# Patient Record
Sex: Female | Born: 1955
Health system: Southern US, Community
[De-identification: ages and names within clinical notes are randomized; demographics above are authoritative.]

## PROBLEM LIST (undated history)

## (undated) DIAGNOSIS — M1811 Unilateral primary osteoarthritis of first carpometacarpal joint, right hand: Secondary | ICD-10-CM

## (undated) DIAGNOSIS — J189 Pneumonia, unspecified organism: Secondary | ICD-10-CM

## (undated) DIAGNOSIS — I10 Essential (primary) hypertension: Secondary | ICD-10-CM

## (undated) DIAGNOSIS — T8859XA Other complications of anesthesia, initial encounter: Secondary | ICD-10-CM

## (undated) DIAGNOSIS — I7300 Raynaud's syndrome without gangrene: Secondary | ICD-10-CM

## (undated) DIAGNOSIS — E559 Vitamin D deficiency, unspecified: Secondary | ICD-10-CM

## (undated) DIAGNOSIS — Z780 Asymptomatic menopausal state: Secondary | ICD-10-CM

## (undated) DIAGNOSIS — E785 Hyperlipidemia, unspecified: Secondary | ICD-10-CM

## (undated) DIAGNOSIS — N200 Calculus of kidney: Secondary | ICD-10-CM

## (undated) DIAGNOSIS — T4145XA Adverse effect of unspecified anesthetic, initial encounter: Secondary | ICD-10-CM

## (undated) DIAGNOSIS — F419 Anxiety disorder, unspecified: Secondary | ICD-10-CM

## (undated) DIAGNOSIS — K219 Gastro-esophageal reflux disease without esophagitis: Secondary | ICD-10-CM

## (undated) DIAGNOSIS — I4719 Other supraventricular tachycardia: Secondary | ICD-10-CM

## (undated) HISTORY — DX: Essential (primary) hypertension: I10

## (undated) HISTORY — PX: ABDOMINAL HYSTERECTOMY: SHX81

## (undated) HISTORY — DX: Other supraventricular tachycardia: I47.19

## (undated) HISTORY — PX: LAPAROSCOPIC APPENDECTOMY: SUR753

## (undated) HISTORY — DX: Vitamin D deficiency, unspecified: E55.9

## (undated) HISTORY — DX: Asymptomatic menopausal state: Z78.0

## (undated) HISTORY — DX: Pneumonia, unspecified organism: J18.9

## (undated) HISTORY — PX: ABLATION ON ENDOMETRIOSIS: SHX5787

## (undated) HISTORY — DX: Anxiety disorder, unspecified: F41.9

## (undated) HISTORY — DX: Calculus of kidney: N20.0

## (undated) HISTORY — DX: Raynaud's syndrome without gangrene: I73.00

## (undated) HISTORY — DX: Hyperlipidemia, unspecified: E78.5

---

## 1985-10-27 HISTORY — PX: FOOT SURGERY: SHX648

## 2000-08-03 ENCOUNTER — Encounter: Payer: Self-pay | Admitting: Internal Medicine

## 2000-08-03 ENCOUNTER — Encounter: Admission: RE | Admit: 2000-08-03 | Discharge: 2000-08-03 | Payer: Self-pay | Admitting: Internal Medicine

## 2001-08-05 ENCOUNTER — Encounter: Payer: Self-pay | Admitting: Internal Medicine

## 2001-08-05 ENCOUNTER — Encounter: Admission: RE | Admit: 2001-08-05 | Discharge: 2001-08-05 | Payer: Self-pay | Admitting: Internal Medicine

## 2002-01-24 ENCOUNTER — Encounter: Admission: RE | Admit: 2002-01-24 | Discharge: 2002-01-24 | Payer: Self-pay | Admitting: Internal Medicine

## 2002-01-24 ENCOUNTER — Encounter: Payer: Self-pay | Admitting: Internal Medicine

## 2006-12-05 ENCOUNTER — Encounter: Admission: RE | Admit: 2006-12-05 | Discharge: 2006-12-05 | Payer: Self-pay | Admitting: Otolaryngology

## 2008-08-30 ENCOUNTER — Encounter: Admission: RE | Admit: 2008-08-30 | Discharge: 2008-08-30 | Payer: Self-pay | Admitting: Internal Medicine

## 2009-12-06 ENCOUNTER — Encounter: Admission: RE | Admit: 2009-12-06 | Discharge: 2009-12-06 | Payer: Self-pay | Admitting: Internal Medicine

## 2010-12-26 ENCOUNTER — Other Ambulatory Visit: Payer: Self-pay | Admitting: Internal Medicine

## 2010-12-26 ENCOUNTER — Ambulatory Visit
Admission: RE | Admit: 2010-12-26 | Discharge: 2010-12-26 | Disposition: A | Discharge status: Home / Self Care | Payer: PRIVATE HEALTH INSURANCE | Source: Ambulatory Visit | Attending: Internal Medicine | Admitting: Internal Medicine

## 2010-12-26 DIAGNOSIS — Z0000 Encounter for general adult medical examination without abnormal findings: Secondary | ICD-10-CM

## 2012-01-06 ENCOUNTER — Other Ambulatory Visit: Payer: Self-pay | Admitting: Internal Medicine

## 2012-01-06 DIAGNOSIS — Z1231 Encounter for screening mammogram for malignant neoplasm of breast: Secondary | ICD-10-CM

## 2012-01-19 ENCOUNTER — Ambulatory Visit
Admission: RE | Admit: 2012-01-19 | Discharge: 2012-01-19 | Disposition: A | Discharge status: Home / Self Care | Payer: PRIVATE HEALTH INSURANCE | Source: Ambulatory Visit | Attending: Internal Medicine | Admitting: Internal Medicine

## 2012-01-19 DIAGNOSIS — Z1231 Encounter for screening mammogram for malignant neoplasm of breast: Secondary | ICD-10-CM

## 2013-07-12 ENCOUNTER — Other Ambulatory Visit: Payer: Self-pay

## 2013-07-12 DIAGNOSIS — Z1231 Encounter for screening mammogram for malignant neoplasm of breast: Secondary | ICD-10-CM

## 2013-07-28 ENCOUNTER — Ambulatory Visit
Admission: RE | Admit: 2013-07-28 | Discharge: 2013-07-28 | Disposition: A | Discharge status: Home / Self Care | Payer: BC Managed Care – PPO | Source: Ambulatory Visit

## 2013-07-28 DIAGNOSIS — Z1231 Encounter for screening mammogram for malignant neoplasm of breast: Secondary | ICD-10-CM

## 2015-02-12 ENCOUNTER — Ambulatory Visit
Admission: RE | Admit: 2015-02-12 | Discharge: 2015-02-12 | Disposition: A | Discharge status: Home / Self Care | Payer: BLUE CROSS/BLUE SHIELD | Source: Ambulatory Visit | Attending: Geriatric Medicine | Admitting: Geriatric Medicine

## 2015-02-12 ENCOUNTER — Other Ambulatory Visit: Payer: Self-pay | Admitting: Geriatric Medicine

## 2015-02-12 DIAGNOSIS — M5489 Other dorsalgia: Secondary | ICD-10-CM

## 2016-04-24 LAB — HM COLONOSCOPY

## 2017-04-08 DIAGNOSIS — Z1231 Encounter for screening mammogram for malignant neoplasm of breast: Secondary | ICD-10-CM | POA: Diagnosis not present

## 2017-04-13 DIAGNOSIS — E559 Vitamin D deficiency, unspecified: Secondary | ICD-10-CM | POA: Diagnosis not present

## 2017-04-13 DIAGNOSIS — I10 Essential (primary) hypertension: Secondary | ICD-10-CM | POA: Diagnosis not present

## 2017-04-13 DIAGNOSIS — Z23 Encounter for immunization: Secondary | ICD-10-CM | POA: Diagnosis not present

## 2017-04-13 DIAGNOSIS — Z79899 Other long term (current) drug therapy: Secondary | ICD-10-CM | POA: Diagnosis not present

## 2017-04-13 DIAGNOSIS — Z0000 Encounter for general adult medical examination without abnormal findings: Secondary | ICD-10-CM | POA: Diagnosis not present

## 2017-04-14 ENCOUNTER — Encounter: Payer: Self-pay | Admitting: Neurology

## 2017-04-16 NOTE — Progress Notes (Signed)
Elaine Harper was seen today in the movement disorders clinic for neurologic consultation at the request of Lajean Manes, MD.  The consultation is for the evaluation of L hand tremor.  She is L hand dominant   Specific Symptoms:   Tremor: Yes.     How long has it been going on? 6 months  At rest or with activation?  activation  When is it noted the most?  Putting on makeup  Fam hx of tremor?  Father did when he was over 63 y/o  Located where?  L hand  Affected by caffeine:  No. (2 cups coffee per day)  Affected by alcohol:  No. (3 glasses wine per day)  Affected by stress:  No.  Affected by fatigue:  No.  Spills soup if on spoon:  Yes.    Spills glass of liquid if full:  No.  Affects ADL's (tying shoes, brushing teeth, etc):  Yes.     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   (seemed to change when she changed to a hormone patch)  Acting out dreams:  No. Wet Pillows: No. Postural symptoms:  No.  Falls?  No. Bradykinesia symptoms: no bradykinesia noted Loss of smell:  No. Loss of taste:  No. Urinary Incontinence:  No. Difficulty Swallowing:  No. Handwriting, micrographia: No. (but a little tremulous) Depression:  No. Memory changes:  No. N/V:  No. Lightheaded:  No.  Syncope: No. Diplopia:  No. Dyskinesia:  No.  Neuroimaging has not previously been performed in the recent years but had one many years ago.  ALLERGIES:  No Known Allergies  CURRENT MEDICATIONS:  Outpatient Encounter Prescriptions as of 04/21/2017  Medication Sig  . ACZONE 5 % topical gel as needed.  Marland Kitchen aspirin EC 81 MG tablet Take 81 mg by mouth daily.  . Calcium 600-400 MG-UNIT CHEW Chew by mouth.  . cholecalciferol (VITAMIN D) 1000 units tablet Take 1,000 Units by mouth daily.  Marland Kitchen doxycycline (VIBRAMYCIN) 100 MG capsule Take 100 mg by mouth daily.  . fluorouracil (EFUDEX) 5  % cream as needed.  . hydrochlorothiazide (HYDRODIURIL) 25 MG tablet Take 12.5 mg by mouth daily.  Marland Kitchen lisinopril (PRINIVIL,ZESTRIL) 10 MG tablet Take 10 mg by mouth daily.  Marland Kitchen LORazepam (ATIVAN) 1 MG tablet daily as needed.  Marland Kitchen MINIVELLE 0.025 MG/24HR daily.  . naproxen sodium (ANAPROX) 220 MG tablet Take 220 mg by mouth 2 (two) times daily with a meal.  . tretinoin (RETIN-A) 0.025 % cream as needed.  . Turmeric 450 MG CAPS Take by mouth daily.   No facility-administered encounter medications on file as of 04/21/2017.     PAST MEDICAL HISTORY:   Past Medical History:  Diagnosis Date  . Anxiety   . Hypertension   . Menopause     PAST SURGICAL HISTORY:   Past Surgical History:  Procedure Laterality Date  . ABDOMINAL HYSTERECTOMY    . ABLATION ON ENDOMETRIOSIS    . LAPAROSCOPIC APPENDECTOMY      SOCIAL HISTORY:   Social History   Social History  . Marital status: Married    Spouse name: N/A  . Number of children: N/A  . Years of education: N/A   Occupational History  . practice admin     [employer]   Social History Main Topics  . Smoking status: Former Smoker    Quit date: 04/22/1987  .  Smokeless tobacco: Never Used  . Alcohol use Yes     Comment: 3 glasses wine per night  . Drug use: No  . Sexual activity: Not on file   Other Topics Concern  . Not on file   Social History Narrative  . No narrative on file    FAMILY HISTORY:   Family Status  Relation Status  . Mother Deceased  . Father Deceased  . Sister Alive  . Brother Alive  . Brother Deceased  . Brother Alive    ROS:  A complete 10 system review of systems was obtained and was unremarkable apart from what is mentioned above.  PHYSICAL EXAMINATION:    VITALS:   Vitals:   04/21/17 0931  BP: 132/70  Pulse: 60  SpO2: 97%  Weight: 148 lb (67.1 kg)  Height: 5\' 7"  (1.702 m)    GEN:  The patient appears stated age and is in NAD. HEENT:  Normocephalic, atraumatic.  The mucous membranes are  moist. The superficial temporal arteries are without ropiness or tenderness. CV:  RRR Lungs:  CTAB Neck/HEME:  There are no carotid bruits bilaterally.  Neurological examination:  Orientation: The patient is alert and oriented x3. Fund of knowledge is appropriate.  Recent and remote memory are intact.  Attention and concentration are normal.    Able to name objects and repeat phrases. Cranial nerves: There is good facial symmetry. Pupils are equal round and reactive to light bilaterally. Fundoscopic exam reveals clear margins bilaterally. Extraocular muscles are intact. The visual fields are full to confrontational testing. The speech is fluent and clear. Soft palate rises symmetrically and there is no tongue deviation. Hearing is intact to conversational tone. Sensation: Sensation is intact to light and pinprick throughout (facial, trunk, extremities). Vibration is intact at the bilateral big toe. There is no extinction with double simultaneous stimulation. There is no sensory dermatomal level identified. Motor: Strength is 5/5 in the bilateral upper and lower extremities.   Shoulder shrug is equal and symmetric.  There is no pronator drift. Deep tendon reflexes: Deep tendon reflexes are 3/4 at the bilateral biceps, triceps, brachioradialis, patella and achilles. Plantar responses are downgoing bilaterally.  Movement examination: Tone: There is normal tone in the bilateral upper extremities.  The tone in the lower extremities is normal.  Abnormal movements: There is no rest tremor.  There is minimal tremor of the outstretched hand on the L only.  It is somewhat worse when she holds a weight.  It is worse with intention.  She has some trouble with archimedes spirals on the L but when drawing with the L hand (her dominant hand) I was able to note a little tremor on the right.  She does have tremor when pouring water from one glass to another and while the left is worse than the right, tremor is evident  when the full glass in the right hand.   Coordination:  There is no decremation with RAM's, with any form of RAMS, including alternating supination and pronation of the forearm, hand opening and closing, finger taps, heel taps and toe taps. Gait and Station: The patient has no difficulty arising out of a deep-seated chair without the use of the hands. The patient's stride length is normal with good arm swing.    Labs: Patient pulled up her patient portal.  Last TSH was on 02/08/2014 and was normal at 4.30.  She did have other lab work on 04/13/2017.  White blood cells were 3.9, hemoglobin 14.2, hematocrit  41.0 and platelets 261.  Sodium was 137, potassium 5.2, chloride 101, CO2 28, BUN 14 and creatinine 0.74 and glucose 94.  ASSESSMENT/PLAN:  1.  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  While she does have minor tremor on the right, it is much more significant on the left.  This can happen in about one third of cases.  Given the asymmetric nature and her hyperreflexia, we decided to go ahead and do an MRI of the brain.  Decided to hold off on an MRI of the cervical spine given no significant neck pain.  No other features of myelopathy either.  Reassured her that I did not see any evidence of a neurodegenerative process such as Parkinson's disease.  -talked with her about complex effect that alcohol can have on tremor.  Chronic alcohol use can produce a tremor but discontinuation of the alcohol can also cause a tremulous state for quite some time.  Talked with her about affects of stress on tremor but she doesn't note that.  -I am going to go ahead and do a TSH.  It has been a few years since this was done.  -We discussed that this can continue to gradually get worse with time.  We discussed that some medications can worsen this, as can caffeine use.  We discussed medication therapy as well as surgical therapy.  Ultimately, the patient decided to think about it and hold off on medicine.  She  is fairly certain she does not want a beta blocker as she runs and does not want to decrease the ability to get her heart rate up.  We did talk about primidone in detail and she is going to think about that.  I will see her back pending her decision on medicine and pending the above results.  2.  Much greater than 50% of this visit was spent in counseling and coordinating care.  Total face to face time:  45 min       Cc:  Lajean Manes, MD

## 2017-04-21 ENCOUNTER — Other Ambulatory Visit: Payer: 59

## 2017-04-21 ENCOUNTER — Ambulatory Visit (INDEPENDENT_AMBULATORY_CARE_PROVIDER_SITE_OTHER): Payer: 59 | Admitting: Neurology

## 2017-04-21 ENCOUNTER — Encounter: Payer: Self-pay | Admitting: Neurology

## 2017-04-21 VITALS — BP 132/70 | HR 60 | Ht 67.0 in | Wt 148.0 lb

## 2017-04-21 DIAGNOSIS — R292 Abnormal reflex: Secondary | ICD-10-CM

## 2017-04-21 DIAGNOSIS — R251 Tremor, unspecified: Secondary | ICD-10-CM

## 2017-04-21 LAB — TSH: TSH: 2.84 m[IU]/L

## 2017-04-21 NOTE — Patient Instructions (Signed)
1. Your provider has requested that you have labwork completed today. Please go to [facility] ([id number]) on the second floor of this building before leaving the office today. You do not need to check in. If you are not called within 15 minutes please check with the front desk.   2. We have sent a referral to  Imaging for your MRI and they will call you directly to schedule your appt. They are located at [address] If you need to contact them directly please call [phone].    

## 2017-04-22 ENCOUNTER — Telehealth: Payer: Self-pay | Admitting: Neurology

## 2017-04-22 NOTE — Telephone Encounter (Signed)
[facility] making patient aware (Per DPR) that labs okay. She is to call back with any questions.

## 2017-04-22 NOTE — Telephone Encounter (Signed)
-----   Message from [place], DO sent at 04/22/2017  8:06 AM EDT ----- Let pt know that her TSH is normal

## 2017-05-01 ENCOUNTER — Telehealth: Payer: Self-pay | Admitting: Neurology

## 2017-05-01 NOTE — Telephone Encounter (Signed)
Spoke with patient and made her aware it looks like records have been sent for medical review.  She will call cancel scan for now and r/s once we receive authorization.   Lesly Rubenstein- can you keep an eye on referral and let me know when authorized?

## 2017-05-01 NOTE — Telephone Encounter (Signed)
PT called and said she is having an MRI on Monday and the imaging told her they have not received prior auth from [employer]

## 2017-05-04 ENCOUNTER — Other Ambulatory Visit: Payer: 59

## 2017-05-11 DIAGNOSIS — D485 Neoplasm of uncertain behavior of skin: Secondary | ICD-10-CM | POA: Diagnosis not present

## 2017-05-12 ENCOUNTER — Telehealth: Payer: Self-pay | Admitting: Neurology

## 2017-05-12 NOTE — Telephone Encounter (Signed)
Have you heard this needs peer to peer?

## 2017-05-12 NOTE — Telephone Encounter (Signed)
Caller: Andris Flurry  Urgent? No  Reason for the call: [facility] is needing a peer to peer regarding her prior Authorization for her MRI. She said her status is pending for her MRI. Her case # is [id number]. Please call [phone]. Thanks

## 2017-05-13 NOTE — Telephone Encounter (Signed)
Thanks so much!  Dr. Carles Collet Juluis Rainier.

## 2017-05-13 NOTE — Telephone Encounter (Signed)
FYI - Dr. Carles Collet

## 2017-05-13 NOTE — Telephone Encounter (Signed)
Per Clinical Coord. Domenica Reamer @ [employer] this case was not approved because of the reason of exam is being ordered (any movement disorder, tremors) it requires a peer to peer. They will call tomorrow 07/19 @ 11:00am for a peer to peer with Dr Tat.

## 2017-05-13 NOTE — Telephone Encounter (Signed)
I spoke to [facility] and Dr. Mickie Hillier will be calling Dr Tat at 12:00pm until 12:15pm for cpt [phone]

## 2017-05-13 NOTE — Telephone Encounter (Signed)
I will be in with patients at that time.  Can we schedule that at noon when I am not in with other patients

## 2017-05-13 NOTE — Telephone Encounter (Signed)
Is this possible?

## 2017-05-15 NOTE — Telephone Encounter (Signed)
She never received a call.   Is there a number we can contact for peer-to-peer?

## 2017-05-15 NOTE — Telephone Encounter (Signed)
FYI DR. Tat

## 2017-05-15 NOTE — Telephone Encounter (Signed)
I called.  Would not do peer to peer.  Scheduled for 12:15pm on monday

## 2017-05-15 NOTE — Telephone Encounter (Signed)
[phone] option 3 Dr. Mickie Hillier was the Dr that was supposed to call

## 2017-05-18 NOTE — Telephone Encounter (Signed)
Approval: [phone] good through 07/02/17

## 2017-05-18 NOTE — Telephone Encounter (Signed)
Information put in referral. Patient made aware she can r/s her scan.

## 2017-05-20 ENCOUNTER — Ambulatory Visit
Admission: RE | Admit: 2017-05-20 | Discharge: 2017-05-20 | Disposition: A | Discharge status: Home / Self Care | Payer: 59 | Source: Ambulatory Visit | Attending: Neurology | Admitting: Neurology

## 2017-05-20 DIAGNOSIS — R292 Abnormal reflex: Secondary | ICD-10-CM

## 2017-05-20 DIAGNOSIS — R251 Tremor, unspecified: Secondary | ICD-10-CM | POA: Diagnosis not present

## 2017-05-21 ENCOUNTER — Telehealth: Payer: Self-pay | Admitting: Neurology

## 2017-05-21 NOTE — Telephone Encounter (Signed)
-----   Message from Cameron Sprang, MD sent at 05/21/2017  1:28 PM EDT ----- Pls let patient know MRI brain did not show any evidence of tumor, stroke, or bleed, it showed age-related changes.

## 2017-05-21 NOTE — Telephone Encounter (Signed)
Left message on machine for patient to call back.

## 2017-05-22 NOTE — Telephone Encounter (Signed)
Left message on machine for patient to call back.

## 2017-05-22 NOTE — Telephone Encounter (Signed)
Patient made aware of results.  

## 2017-05-22 NOTE — Telephone Encounter (Signed)
PT returned your call..  °

## 2017-05-25 DIAGNOSIS — D485 Neoplasm of uncertain behavior of skin: Secondary | ICD-10-CM | POA: Diagnosis not present

## 2017-05-25 DIAGNOSIS — C44112 Basal cell carcinoma of skin of right eyelid, including canthus: Secondary | ICD-10-CM | POA: Diagnosis not present

## 2017-06-11 DIAGNOSIS — C44112 Basal cell carcinoma of skin of right eyelid, including canthus: Secondary | ICD-10-CM | POA: Diagnosis not present

## 2017-06-25 DIAGNOSIS — S0181XA Laceration without foreign body of other part of head, initial encounter: Secondary | ICD-10-CM | POA: Diagnosis not present

## 2017-06-25 DIAGNOSIS — S01401A Unspecified open wound of right cheek and temporomandibular area, initial encounter: Secondary | ICD-10-CM | POA: Diagnosis not present

## 2017-06-25 DIAGNOSIS — C44119 Basal cell carcinoma of skin of left eyelid, including canthus: Secondary | ICD-10-CM | POA: Diagnosis not present

## 2017-07-16 DIAGNOSIS — L570 Actinic keratosis: Secondary | ICD-10-CM | POA: Diagnosis not present

## 2017-07-16 DIAGNOSIS — C44219 Basal cell carcinoma of skin of left ear and external auricular canal: Secondary | ICD-10-CM | POA: Diagnosis not present

## 2017-07-16 DIAGNOSIS — L814 Other melanin hyperpigmentation: Secondary | ICD-10-CM | POA: Diagnosis not present

## 2017-07-16 DIAGNOSIS — Z85828 Personal history of other malignant neoplasm of skin: Secondary | ICD-10-CM | POA: Diagnosis not present

## 2017-08-03 DIAGNOSIS — Z483 Aftercare following surgery for neoplasm: Secondary | ICD-10-CM | POA: Diagnosis not present

## 2017-08-03 DIAGNOSIS — C441121 Basal cell carcinoma of skin of right upper eyelid, including canthus: Secondary | ICD-10-CM | POA: Diagnosis not present

## 2017-08-03 DIAGNOSIS — Z85828 Personal history of other malignant neoplasm of skin: Secondary | ICD-10-CM | POA: Diagnosis not present

## 2017-08-03 DIAGNOSIS — Z481 Encounter for planned postprocedural wound closure: Secondary | ICD-10-CM | POA: Diagnosis not present

## 2017-08-03 DIAGNOSIS — S01401A Unspecified open wound of right cheek and temporomandibular area, initial encounter: Secondary | ICD-10-CM | POA: Diagnosis not present

## 2017-08-03 DIAGNOSIS — S01101A Unspecified open wound of right eyelid and periocular area, initial encounter: Secondary | ICD-10-CM | POA: Diagnosis not present

## 2017-08-03 DIAGNOSIS — H02001 Unspecified entropion of right upper eyelid: Secondary | ICD-10-CM | POA: Diagnosis not present

## 2017-08-03 DIAGNOSIS — S0181XA Laceration without foreign body of other part of head, initial encounter: Secondary | ICD-10-CM | POA: Diagnosis not present

## 2017-08-18 DIAGNOSIS — Z85828 Personal history of other malignant neoplasm of skin: Secondary | ICD-10-CM | POA: Diagnosis not present

## 2017-08-18 DIAGNOSIS — C44219 Basal cell carcinoma of skin of left ear and external auricular canal: Secondary | ICD-10-CM | POA: Diagnosis not present

## 2017-08-21 DIAGNOSIS — H43811 Vitreous degeneration, right eye: Secondary | ICD-10-CM | POA: Diagnosis not present

## 2017-11-12 DIAGNOSIS — Z79899 Other long term (current) drug therapy: Secondary | ICD-10-CM | POA: Diagnosis not present

## 2017-11-12 DIAGNOSIS — I10 Essential (primary) hypertension: Secondary | ICD-10-CM | POA: Diagnosis not present

## 2017-11-23 DIAGNOSIS — H43811 Vitreous degeneration, right eye: Secondary | ICD-10-CM | POA: Diagnosis not present

## 2018-04-30 DIAGNOSIS — Z1231 Encounter for screening mammogram for malignant neoplasm of breast: Secondary | ICD-10-CM | POA: Diagnosis not present

## 2018-05-03 DIAGNOSIS — I10 Essential (primary) hypertension: Secondary | ICD-10-CM | POA: Diagnosis not present

## 2018-05-03 DIAGNOSIS — Z0000 Encounter for general adult medical examination without abnormal findings: Secondary | ICD-10-CM | POA: Diagnosis not present

## 2018-05-03 DIAGNOSIS — Z79899 Other long term (current) drug therapy: Secondary | ICD-10-CM | POA: Diagnosis not present

## 2018-06-14 DIAGNOSIS — H04123 Dry eye syndrome of bilateral lacrimal glands: Secondary | ICD-10-CM | POA: Diagnosis not present

## 2018-07-06 DIAGNOSIS — H04123 Dry eye syndrome of bilateral lacrimal glands: Secondary | ICD-10-CM | POA: Diagnosis not present

## 2018-08-27 DIAGNOSIS — D225 Melanocytic nevi of trunk: Secondary | ICD-10-CM | POA: Diagnosis not present

## 2018-08-27 DIAGNOSIS — D1801 Hemangioma of skin and subcutaneous tissue: Secondary | ICD-10-CM | POA: Diagnosis not present

## 2018-08-27 DIAGNOSIS — Z85828 Personal history of other malignant neoplasm of skin: Secondary | ICD-10-CM | POA: Diagnosis not present

## 2018-08-27 DIAGNOSIS — L570 Actinic keratosis: Secondary | ICD-10-CM | POA: Diagnosis not present

## 2018-08-27 DIAGNOSIS — C4371 Malignant melanoma of right lower limb, including hip: Secondary | ICD-10-CM | POA: Diagnosis not present

## 2018-08-27 DIAGNOSIS — C44712 Basal cell carcinoma of skin of right lower limb, including hip: Secondary | ICD-10-CM | POA: Diagnosis not present

## 2018-09-13 ENCOUNTER — Other Ambulatory Visit: Payer: Self-pay | Admitting: General Surgery

## 2018-09-13 ENCOUNTER — Other Ambulatory Visit: Payer: Self-pay

## 2018-09-13 ENCOUNTER — Encounter (HOSPITAL_BASED_OUTPATIENT_CLINIC_OR_DEPARTMENT_OTHER): Payer: Self-pay | Admitting: *Deleted

## 2018-09-13 DIAGNOSIS — C4371 Malignant melanoma of right lower limb, including hip: Secondary | ICD-10-CM

## 2018-09-13 NOTE — H&P (Signed)
Elaine Harper Documented: 09/13/2018 11:51 AM Location: [facility] Patient #: [id number] DOB: Elaine 23, 1957 Married / Language: Elaine Harper / Race: White Female   History of Present Illness Stark Klein MD; 09/13/2018 1:50 PM) The patient is a 62 year old female who presents with malignant melanoma. Pt is a 62 yo F referred for consultation by Dr. Ubaldo Glassing for a diagnosis of melanoma 08/27/2018. She sees dermatology at least 3-4 times per year due to h/o basal cell carcinomas. This mole had not caught her attention. She had a biopsy of a lesion near the proximal lateral right calf that showed malignant melanoma, superficial spreading, clarks IV, breslow 1.1 mm, peripheral margins +, deep margin -, no ulceration or satellitosis. Mitoses were 2/mm2. no LVI, neurotropism, or regression was seen.; TIL were non brisk. She has no other history of melanoma or other cancer, but her brother had melanoma.    Past Surgical History (Elaine Harper, [place]; 09/13/2018 11:51 AM) Appendectomy  Foot Surgery  Left. Hysterectomy (not due to cancer) - Complete   Diagnostic Studies History (Elaine Harper, CMA; 09/13/2018 11:51 AM) Colonoscopy  1-5 years ago Mammogram  within last year Pap Smear  >5 years ago  Allergies (Elaine Harper, [place]; 09/13/2018 11:53 AM) No Known Drug Allergies [09/13/2018]:  Medication History (Elaine Harper, CMA; 09/13/2018 11:55 AM) LORazepam (1MG  Tablet, Oral) Active. Spironolactone (50MG  Tablet, Oral) Active. Estradiol (1MG  Tablet, Oral) Active. hydroCHLOROthiazide (25MG  Tablet, Oral) Active. Lisinopril (10MG  Tablet, Oral) Active. Vitamin D (1000UNIT Tablet, Oral) Active. Medications Reconciled  Social History (Elaine Harper, CMA; 09/13/2018 11:51 AM) Alcohol use  Moderate alcohol use. Caffeine use  Coffee. No drug use  Tobacco use  Former smoker.  Family History (Elaine Harper, [place]; 09/13/2018 11:51 AM) Alcohol Abuse  Mother. Arthritis   Father, Mother. Heart Disease  Father. Melanoma  Brother.  Pregnancy / Birth History (Elaine Harper, [place]; 09/13/2018 11:51 AM) Age at menarche  34 years. Age of menopause  <43 Gravida  0 Para  0  Other Problems (Elaine Harper, CMA; 09/13/2018 11:51 AM) Arthritis  High blood pressure  Melanoma  Oophorectomy     Review of Systems (Elaine Harper CMA; 09/13/2018 11:51 AM) General Not Present- Appetite Loss, Chills, Fatigue, Fever, Night Sweats, Weight Gain and Weight Loss. Skin Present- Change in Wart/Mole. Not Present- Dryness, Hives, Jaundice, New Lesions, Non-Healing Wounds, Rash and Ulcer. HEENT Present- Hearing Loss, Ringing in the Ears and Wears glasses/contact lenses. Not Present- Earache, Hoarseness, Nose Bleed, Oral Ulcers, Seasonal Allergies, Sinus Pain, Sore Throat, Visual Disturbances and Yellow Eyes. Respiratory Not Present- Bloody sputum, Chronic Cough, Difficulty Breathing, Snoring and Wheezing. Cardiovascular Not Present- Chest Pain, Difficulty Breathing Lying Down, Leg Cramps, Palpitations, Rapid Heart Rate, Shortness of Breath and Swelling of Extremities. Gastrointestinal Not Present- Abdominal Pain, Bloating, Bloody Stool, Change in Bowel Habits, Chronic diarrhea, Constipation, Difficulty Swallowing, Excessive gas, Gets full quickly at meals, Hemorrhoids, Indigestion, Nausea, Rectal Pain and Vomiting. Female Genitourinary Not Present- Frequency, Nocturia, Painful Urination, Pelvic Pain and Urgency. Musculoskeletal Present- Back Pain. Not Present- Joint Pain, Joint Stiffness, Muscle Pain, Muscle Weakness and Swelling of Extremities. Neurological Present- Tremor. Not Present- Decreased Memory, Fainting, Headaches, Numbness, Seizures, Tingling, Trouble walking and Weakness. Psychiatric Not Present- Anxiety, Bipolar, Change in Sleep Pattern, Depression, Fearful and Frequent crying. Endocrine Not Present- Cold Intolerance, Excessive Hunger, Hair Changes, Heat  Intolerance, Hot flashes and New Diabetes. Hematology Not Present- Blood Thinners, Easy Bruising, Excessive bleeding, Gland problems, HIV and Persistent Infections.  Vitals (Elaine Harper CMA; 09/13/2018 11:55 AM) 09/13/2018  11:55 AM Weight: 151.13 lb Height: 67in Body Surface Area: 1.8 m Body Mass Index: 23.67 kg/m  Temp.: 97.62F(Oral)  BP: 140/86 (Sitting, Left Arm, Standard)       Physical Exam Stark Klein MD; 09/13/2018 1:53 PM) General Mental Status-Alert. General Appearance-Consistent with stated age. Hydration-Well hydrated. Voice-Normal.  Integumentary Note: 8-10 mm scar around 6-7 cm from knee.   Head and Neck Head-normocephalic, atraumatic with no lesions or palpable masses. Trachea-midline. Thyroid Gland Characteristics - normal size and consistency.  Eye Eyeball - Bilateral-Extraocular movements intact. Sclera/Conjunctiva - Bilateral-No scleral icterus.  Chest and Lung Exam Chest and lung exam reveals -quiet, even and easy respiratory effort with no use of accessory muscles and on auscultation, normal breath sounds, no adventitious sounds and normal vocal resonance. Inspection Chest Wall - Normal. Back - normal.  Cardiovascular Cardiovascular examination reveals -normal heart sounds, regular rate and rhythm with no murmurs and normal pedal pulses bilaterally.  Abdomen Inspection Inspection of the abdomen reveals - No Hernias. Palpation/Percussion Palpation and Percussion of the abdomen reveal - Soft, Non Tender, No Rebound tenderness, No Rigidity (guarding) and No hepatosplenomegaly. Auscultation Auscultation of the abdomen reveals - Bowel sounds normal.  Neurologic Neurologic evaluation reveals -alert and oriented x 3 with no impairment of recent or remote memory. Mental Status-Normal.  Musculoskeletal Global Assessment -Note: no gross deformities.  Normal Exam - Left-Upper Extremity Strength Normal  and Lower Extremity Strength Normal. Normal Exam - Right-Upper Extremity Strength Normal and Lower Extremity Strength Normal.  Lymphatic Head & Neck  General Head & Neck Lymphatics: Bilateral - Description - Normal. Axillary  General Axillary Region: Bilateral - Description - Normal. Tenderness - Non Tender. Femoral & Inguinal  Generalized Femoral & Inguinal Lymphatics: Bilateral - Description - No Generalized lymphadenopathy.    Assessment & Plan Stark Klein MD; 09/13/2018 1:56 PM) MALIGNANT MELANOMA OF RIGHT LOWER LEG (C43.71) Impression: Will plan wide local excision wtih 1-2 cm margins of the melanoma. Will also plan sentinel lymph node mapping and biopsy.  Discussed surgery with the patient. Discussed rationale for wide excision and the node injection/biopsy. Reviewed risks of wound breakdown, numbness, infection, and seroma with the patient. Discussed post op restrictions.  Could do surgery this week or next week, but pt would like to wait until after thanksgiving to do surgery because she has a trip planned to [place] next week. Current Plans Schedule for Surgery Pt Education - Melanoma: skin cancer   Signed by Stark Klein, MD (09/13/2018 1:56 PM)

## 2018-09-14 ENCOUNTER — Encounter (HOSPITAL_BASED_OUTPATIENT_CLINIC_OR_DEPARTMENT_OTHER)
Admission: RE | Admit: 2018-09-14 | Discharge: 2018-09-14 | Disposition: A | Discharge status: Home / Self Care | Payer: 59 | Source: Ambulatory Visit | Attending: General Surgery | Admitting: General Surgery

## 2018-09-14 DIAGNOSIS — Z79899 Other long term (current) drug therapy: Secondary | ICD-10-CM | POA: Diagnosis not present

## 2018-09-14 DIAGNOSIS — Z8249 Family history of ischemic heart disease and other diseases of the circulatory system: Secondary | ICD-10-CM | POA: Diagnosis not present

## 2018-09-14 DIAGNOSIS — Z808 Family history of malignant neoplasm of other organs or systems: Secondary | ICD-10-CM | POA: Diagnosis not present

## 2018-09-14 DIAGNOSIS — Z90710 Acquired absence of both cervix and uterus: Secondary | ICD-10-CM | POA: Diagnosis not present

## 2018-09-14 DIAGNOSIS — F419 Anxiety disorder, unspecified: Secondary | ICD-10-CM | POA: Diagnosis not present

## 2018-09-14 DIAGNOSIS — C4371 Malignant melanoma of right lower limb, including hip: Secondary | ICD-10-CM | POA: Diagnosis not present

## 2018-09-14 DIAGNOSIS — Z01810 Encounter for preprocedural cardiovascular examination: Secondary | ICD-10-CM | POA: Insufficient documentation

## 2018-09-14 DIAGNOSIS — Z8261 Family history of arthritis: Secondary | ICD-10-CM | POA: Diagnosis not present

## 2018-09-14 DIAGNOSIS — Z811 Family history of alcohol abuse and dependence: Secondary | ICD-10-CM | POA: Diagnosis not present

## 2018-09-14 DIAGNOSIS — I10 Essential (primary) hypertension: Secondary | ICD-10-CM | POA: Diagnosis not present

## 2018-09-14 DIAGNOSIS — M1990 Unspecified osteoarthritis, unspecified site: Secondary | ICD-10-CM | POA: Diagnosis not present

## 2018-09-14 DIAGNOSIS — I491 Atrial premature depolarization: Secondary | ICD-10-CM | POA: Diagnosis not present

## 2018-09-14 DIAGNOSIS — Z87891 Personal history of nicotine dependence: Secondary | ICD-10-CM | POA: Diagnosis not present

## 2018-09-14 LAB — CBC WITH DIFFERENTIAL/PLATELET
ABS IMMATURE GRANULOCYTES: 0.01 10*3/uL (ref 0.00–0.07)
BASOS ABS: 0 10*3/uL (ref 0.0–0.1)
Basophils Relative: 1 %
Eosinophils Absolute: 0.1 10*3/uL (ref 0.0–0.5)
Eosinophils Relative: 2 %
HEMATOCRIT: 41.6 % (ref 36.0–46.0)
Hemoglobin: 13.8 g/dL (ref 12.0–15.0)
Immature Granulocytes: 0 %
LYMPHS ABS: 2.1 10*3/uL (ref 0.7–4.0)
LYMPHS PCT: 43 %
MCH: 30.1 pg (ref 26.0–34.0)
MCHC: 33.2 g/dL (ref 30.0–36.0)
MCV: 90.8 fL (ref 80.0–100.0)
MONOS PCT: 7 %
Monocytes Absolute: 0.3 10*3/uL (ref 0.1–1.0)
NEUTROS ABS: 2.3 10*3/uL (ref 1.7–7.7)
NRBC: 0 % (ref 0.0–0.2)
Neutrophils Relative %: 47 %
Platelets: 260 10*3/uL (ref 150–400)
RBC: 4.58 MIL/uL (ref 3.87–5.11)
RDW: 11.9 % (ref 11.5–15.5)
WBC: 4.9 10*3/uL (ref 4.0–10.5)

## 2018-09-14 LAB — COMPREHENSIVE METABOLIC PANEL
ALBUMIN: 4 g/dL (ref 3.5–5.0)
ALT: 19 U/L (ref 0–44)
AST: 22 U/L (ref 15–41)
Alkaline Phosphatase: 47 U/L (ref 38–126)
Anion gap: 9 (ref 5–15)
BILIRUBIN TOTAL: 0.6 mg/dL (ref 0.3–1.2)
BUN: 17 mg/dL (ref 8–23)
CO2: 24 mmol/L (ref 22–32)
CREATININE: 0.84 mg/dL (ref 0.44–1.00)
Calcium: 8.9 mg/dL (ref 8.9–10.3)
Chloride: 104 mmol/L (ref 98–111)
GFR calc Af Amer: >60 mL/min (ref >60)
GFR calc non Af Amer: >60 mL/min (ref >60)
Glucose, Bld: 70 mg/dL (ref 70–99)
POTASSIUM: 4.6 mmol/L (ref 3.5–5.1)
Sodium: 137 mmol/L (ref 135–145)
TOTAL PROTEIN: 6.5 g/dL (ref 6.5–8.1)

## 2018-09-14 NOTE — Progress Notes (Addendum)
Ensure pre surgery drink given with instructions to complete by [place], surgical soap with instructions, pt verbalized understanding.  EKG reviewed by Dr. Lissa Hoard, will proceed with surgery as scheduled.

## 2018-09-16 ENCOUNTER — Other Ambulatory Visit: Payer: Self-pay

## 2018-09-16 ENCOUNTER — Ambulatory Visit (HOSPITAL_COMMUNITY)
Admission: RE | Admit: 2018-09-16 | Discharge: 2018-09-16 | Disposition: A | Discharge status: Home / Self Care | Payer: 59 | Source: Ambulatory Visit | Attending: General Surgery | Admitting: General Surgery

## 2018-09-16 ENCOUNTER — Ambulatory Visit (HOSPITAL_BASED_OUTPATIENT_CLINIC_OR_DEPARTMENT_OTHER): Payer: 59 | Admitting: Anesthesiology

## 2018-09-16 ENCOUNTER — Encounter (HOSPITAL_BASED_OUTPATIENT_CLINIC_OR_DEPARTMENT_OTHER): Payer: Self-pay | Admitting: *Deleted

## 2018-09-16 ENCOUNTER — Encounter (HOSPITAL_BASED_OUTPATIENT_CLINIC_OR_DEPARTMENT_OTHER)
Admission: RE | Disposition: A | Discharge status: Home / Self Care | Payer: Self-pay | Source: Ambulatory Visit | Attending: General Surgery

## 2018-09-16 ENCOUNTER — Ambulatory Visit (HOSPITAL_BASED_OUTPATIENT_CLINIC_OR_DEPARTMENT_OTHER)
Admission: RE | Admit: 2018-09-16 | Discharge: 2018-09-16 | Disposition: A | Discharge status: Home / Self Care | Payer: 59 | Source: Ambulatory Visit | Attending: General Surgery | Admitting: General Surgery

## 2018-09-16 DIAGNOSIS — Z808 Family history of malignant neoplasm of other organs or systems: Secondary | ICD-10-CM | POA: Insufficient documentation

## 2018-09-16 DIAGNOSIS — Z8249 Family history of ischemic heart disease and other diseases of the circulatory system: Secondary | ICD-10-CM | POA: Insufficient documentation

## 2018-09-16 DIAGNOSIS — M1990 Unspecified osteoarthritis, unspecified site: Secondary | ICD-10-CM | POA: Insufficient documentation

## 2018-09-16 DIAGNOSIS — F419 Anxiety disorder, unspecified: Secondary | ICD-10-CM | POA: Insufficient documentation

## 2018-09-16 DIAGNOSIS — Z79899 Other long term (current) drug therapy: Secondary | ICD-10-CM | POA: Insufficient documentation

## 2018-09-16 DIAGNOSIS — Z811 Family history of alcohol abuse and dependence: Secondary | ICD-10-CM | POA: Insufficient documentation

## 2018-09-16 DIAGNOSIS — Z90710 Acquired absence of both cervix and uterus: Secondary | ICD-10-CM | POA: Diagnosis not present

## 2018-09-16 DIAGNOSIS — I491 Atrial premature depolarization: Secondary | ICD-10-CM | POA: Insufficient documentation

## 2018-09-16 DIAGNOSIS — C4371 Malignant melanoma of right lower limb, including hip: Secondary | ICD-10-CM | POA: Diagnosis not present

## 2018-09-16 DIAGNOSIS — I10 Essential (primary) hypertension: Secondary | ICD-10-CM | POA: Insufficient documentation

## 2018-09-16 DIAGNOSIS — Z8261 Family history of arthritis: Secondary | ICD-10-CM | POA: Insufficient documentation

## 2018-09-16 DIAGNOSIS — Z87891 Personal history of nicotine dependence: Secondary | ICD-10-CM | POA: Insufficient documentation

## 2018-09-16 DIAGNOSIS — D0371 Melanoma in situ of right lower limb, including hip: Secondary | ICD-10-CM | POA: Diagnosis not present

## 2018-09-16 HISTORY — PX: MELANOMA EXCISION: SHX5266

## 2018-09-16 HISTORY — DX: Other complications of anesthesia, initial encounter: T88.59XA

## 2018-09-16 HISTORY — PX: SENTINEL NODE BIOPSY: SHX6608

## 2018-09-16 HISTORY — DX: Adverse effect of unspecified anesthetic, initial encounter: T41.45XA

## 2018-09-16 SURGERY — EXCISION, MELANOMA
Anesthesia: General | Laterality: Right

## 2018-09-16 MED ORDER — MIDAZOLAM HCL 2 MG/2ML IJ SOLN
INTRAMUSCULAR | Status: AC
  Filled 2018-09-16: qty 2

## 2018-09-16 MED ORDER — ONDANSETRON HCL 4 MG/2ML IJ SOLN: 4.0000 mg | Freq: Once | INTRAMUSCULAR | Status: DC | PRN

## 2018-09-16 MED ORDER — MIDAZOLAM HCL 2 MG/2ML IJ SOLN
INTRAMUSCULAR | Status: DC | PRN
  Administered 2018-09-16: 2 mg via INTRAVENOUS

## 2018-09-16 MED ORDER — GABAPENTIN 300 MG PO CAPS
300.0000 mg | ORAL_CAPSULE | ORAL | Status: AC
  Administered 2018-09-16: 300 mg via ORAL

## 2018-09-16 MED ORDER — DEXAMETHASONE SODIUM PHOSPHATE 10 MG/ML IJ SOLN
INTRAMUSCULAR | Status: DC | PRN
  Administered 2018-09-16: 10 mg via INTRAVENOUS

## 2018-09-16 MED ORDER — FENTANYL CITRATE (PF) 100 MCG/2ML IJ SOLN
50.0000 ug | INTRAMUSCULAR | Status: DC | PRN
  Administered 2018-09-16: 50 ug via INTRAVENOUS

## 2018-09-16 MED ORDER — ONDANSETRON HCL 4 MG/2ML IJ SOLN
INTRAMUSCULAR | Status: DC | PRN
  Administered 2018-09-16: 4 mg via INTRAVENOUS

## 2018-09-16 MED ORDER — TECHNETIUM TC 99M SULFUR COLLOID FILTERED
0.5000 | Freq: Once | INTRAVENOUS | Status: AC | PRN
  Administered 2018-09-16: 0.5 via INTRADERMAL

## 2018-09-16 MED ORDER — SODIUM CHLORIDE 0.9 % IR SOLN
Status: DC | PRN
  Administered 2018-09-16: 1000 mL

## 2018-09-16 MED ORDER — METHYLENE BLUE 0.5 % INJ SOLN
INTRAVENOUS | Status: AC
  Filled 2018-09-16: qty 10

## 2018-09-16 MED ORDER — CEFAZOLIN SODIUM-DEXTROSE 2-4 GM/100ML-% IV SOLN
2.0000 g | INTRAVENOUS | Status: AC
  Administered 2018-09-16: 2 g via INTRAVENOUS

## 2018-09-16 MED ORDER — FENTANYL CITRATE (PF) 100 MCG/2ML IJ SOLN
INTRAMUSCULAR | Status: AC
  Filled 2018-09-16: qty 2

## 2018-09-16 MED ORDER — DEXAMETHASONE SODIUM PHOSPHATE 10 MG/ML IJ SOLN
INTRAMUSCULAR | Status: AC
  Filled 2018-09-16: qty 1

## 2018-09-16 MED ORDER — EPHEDRINE SULFATE 50 MG/ML IJ SOLN
INTRAMUSCULAR | Status: DC | PRN
  Administered 2018-09-16: 10 mg via INTRAVENOUS
  Administered 2018-09-16 (×2): 5 mg via INTRAVENOUS

## 2018-09-16 MED ORDER — ONDANSETRON HCL 4 MG/2ML IJ SOLN
INTRAMUSCULAR | Status: AC
  Filled 2018-09-16: qty 2

## 2018-09-16 MED ORDER — FENTANYL CITRATE (PF) 100 MCG/2ML IJ SOLN
INTRAMUSCULAR | Status: DC | PRN
  Administered 2018-09-16 (×2): 50 ug via INTRAVENOUS

## 2018-09-16 MED ORDER — CHLORHEXIDINE GLUCONATE CLOTH 2 % EX PADS: 6.0000 | MEDICATED_PAD | Freq: Once | CUTANEOUS | Status: DC

## 2018-09-16 MED ORDER — BUPIVACAINE-EPINEPHRINE (PF) 0.25% -1:200000 IJ SOLN
INTRAMUSCULAR | Status: DC | PRN
  Administered 2018-09-16: 25 mL

## 2018-09-16 MED ORDER — FENTANYL CITRATE (PF) 100 MCG/2ML IJ SOLN: 25.0000 ug | INTRAMUSCULAR | Status: DC | PRN

## 2018-09-16 MED ORDER — SCOPOLAMINE 1 MG/3DAYS TD PT72: 1.0000 | MEDICATED_PATCH | Freq: Once | TRANSDERMAL | Status: DC | PRN

## 2018-09-16 MED ORDER — PROPOFOL 10 MG/ML IV BOLUS
INTRAVENOUS | Status: DC | PRN
  Administered 2018-09-16: 100 mg via INTRAVENOUS

## 2018-09-16 MED ORDER — ACETAMINOPHEN 500 MG PO TABS
ORAL_TABLET | ORAL | Status: AC
  Filled 2018-09-16: qty 2

## 2018-09-16 MED ORDER — PROPOFOL 10 MG/ML IV BOLUS
INTRAVENOUS | Status: AC
  Filled 2018-09-16: qty 20

## 2018-09-16 MED ORDER — OXYCODONE HCL 5 MG PO TABS: 5.0000 mg | ORAL_TABLET | Freq: Four times a day (QID) | ORAL | 0 refills | Status: DC | PRN

## 2018-09-16 MED ORDER — MIDAZOLAM HCL 2 MG/2ML IJ SOLN
1.0000 mg | INTRAMUSCULAR | Status: DC | PRN
  Administered 2018-09-16: 1 mg via INTRAVENOUS

## 2018-09-16 MED ORDER — GABAPENTIN 300 MG PO CAPS
ORAL_CAPSULE | ORAL | Status: AC
  Filled 2018-09-16: qty 1

## 2018-09-16 MED ORDER — LACTATED RINGERS IV SOLN
INTRAVENOUS | Status: DC
  Administered 2018-09-16: 13:00:00 via INTRAVENOUS
  Administered 2018-09-16: 10 mL/h via INTRAVENOUS

## 2018-09-16 MED ORDER — CEFAZOLIN SODIUM-DEXTROSE 1-4 GM/50ML-% IV SOLN
INTRAVENOUS | Status: AC
  Filled 2018-09-16: qty 100

## 2018-09-16 MED ORDER — ACETAMINOPHEN 500 MG PO TABS
1000.0000 mg | ORAL_TABLET | ORAL | Status: AC
  Administered 2018-09-16: 1000 mg via ORAL

## 2018-09-16 MED ORDER — METHYLENE BLUE 0.5 % INJ SOLN
INTRAVENOUS | Status: DC | PRN
  Administered 2018-09-16: .5 mL via SUBMUCOSAL

## 2018-09-16 SURGICAL SUPPLY — 72 items
ADH SKN CLS APL DERMABOND .7 (GAUZE/BANDAGES/DRESSINGS) ×2
APL SKNCLS STERI-STRIP NONHPOA (GAUZE/BANDAGES/DRESSINGS) ×2
APPLIER CLIP 11 MED OPEN (CLIP)
APPLIER CLIP 9.375 MED OPEN (MISCELLANEOUS) ×4
APR CLP MED 11 20 MLT OPN (CLIP)
APR CLP MED 9.3 20 MLT OPN (MISCELLANEOUS) ×2
BENZOIN TINCTURE PRP APPL 2/3 (GAUZE/BANDAGES/DRESSINGS) ×2 IMPLANT
BLADE CLIPPER SURG (BLADE) IMPLANT
BLADE HEX COATED 2.75 (ELECTRODE) ×4 IMPLANT
BLADE SURG 10 STRL SS (BLADE) ×4 IMPLANT
BLADE SURG 15 STRL LF DISP TIS (BLADE) ×2 IMPLANT
BLADE SURG 15 STRL SS (BLADE) ×4
BNDG COHESIVE 4X5 TAN STRL (GAUZE/BANDAGES/DRESSINGS) IMPLANT
BNDG GAUZE ELAST 4 BULKY (GAUZE/BANDAGES/DRESSINGS) IMPLANT
CANISTER SUCT 1200ML W/VALVE (MISCELLANEOUS) ×4 IMPLANT
CHLORAPREP W/TINT 26ML (MISCELLANEOUS) ×4 IMPLANT
CLIP APPLIE 11 MED OPEN (CLIP) IMPLANT
CLIP APPLIE 9.375 MED OPEN (MISCELLANEOUS) IMPLANT
CLIP VESOCCLUDE LG 6/CT (CLIP) IMPLANT
CLIP VESOCCLUDE MED 6/CT (CLIP) ×4 IMPLANT
CLIP VESOCCLUDE SM WIDE 6/CT (CLIP) ×4 IMPLANT
CLOSURE WOUND 1/2 X4 (GAUZE/BANDAGES/DRESSINGS) ×1
COVER MAYO STAND STRL (DRAPES) ×2 IMPLANT
COVER PROBE W GEL 5X96 (DRAPES) ×4 IMPLANT
COVER WAND RF STERILE (DRAPES) IMPLANT
DECANTER SPIKE VIAL GLASS SM (MISCELLANEOUS) IMPLANT
DERMABOND ADVANCED (GAUZE/BANDAGES/DRESSINGS) ×2
DERMABOND ADVANCED .7 DNX12 (GAUZE/BANDAGES/DRESSINGS) IMPLANT
DRAPE IMP U-DRAPE 54X76 (DRAPES) IMPLANT
DRAPE UTILITY XL STRL (DRAPES) ×8 IMPLANT
DRSG PAD ABDOMINAL 8X10 ST (GAUZE/BANDAGES/DRESSINGS) IMPLANT
DRSG TEGADERM 4X10 (GAUZE/BANDAGES/DRESSINGS) IMPLANT
DRSG TEGADERM 4X4.75 (GAUZE/BANDAGES/DRESSINGS) ×2 IMPLANT
ELECT REM PT RETURN 9FT ADLT (ELECTROSURGICAL) ×4
ELECTRODE REM PT RTRN 9FT ADLT (ELECTROSURGICAL) ×2 IMPLANT
GAUZE SPONGE 4X4 12PLY STRL (GAUZE/BANDAGES/DRESSINGS) IMPLANT
GAUZE SPONGE 4X4 12PLY STRL LF (GAUZE/BANDAGES/DRESSINGS) IMPLANT
GLOVE BIO SURGEON STRL SZ 6 (GLOVE) ×4 IMPLANT
GLOVE BIOGEL PI IND STRL 6.5 (GLOVE) ×2 IMPLANT
GLOVE BIOGEL PI INDICATOR 6.5 (GLOVE) ×2
GOWN STRL REUS W/ TWL LRG LVL3 (GOWN DISPOSABLE) ×2 IMPLANT
GOWN STRL REUS W/TWL 2XL LVL3 (GOWN DISPOSABLE) ×4 IMPLANT
GOWN STRL REUS W/TWL LRG LVL3 (GOWN DISPOSABLE) ×4
NDL HYPO 25X1 1.5 SAFETY (NEEDLE) ×4 IMPLANT
NDL SAFETY ECLIPSE 18X1.5 (NEEDLE) ×2 IMPLANT
NEEDLE HYPO 18GX1.5 SHARP (NEEDLE) ×4
NEEDLE HYPO 25X1 1.5 SAFETY (NEEDLE) ×4 IMPLANT
NS IRRIG 1000ML POUR BTL (IV SOLUTION) ×2 IMPLANT
PACK BASIN DAY SURGERY FS (CUSTOM PROCEDURE TRAY) ×4 IMPLANT
PACK UNIVERSAL I (CUSTOM PROCEDURE TRAY) ×4 IMPLANT
PENCIL BUTTON HOLSTER BLD 10FT (ELECTRODE) ×4 IMPLANT
SLEEVE SCD COMPRESS KNEE MED (MISCELLANEOUS) ×4 IMPLANT
SLING ARM FOAM STRAP LRG (SOFTGOODS) IMPLANT
SPONGE LAP 18X18 RF (DISPOSABLE) IMPLANT
STOCKINETTE IMPERVIOUS LG (DRAPES) ×2 IMPLANT
STRIP CLOSURE SKIN 1/2X4 (GAUZE/BANDAGES/DRESSINGS) ×1 IMPLANT
SUT ETHILON 2 0 FS 18 (SUTURE) IMPLANT
SUT MON AB 4-0 PC3 18 (SUTURE) ×4 IMPLANT
SUT SILK 2 0 SH (SUTURE) IMPLANT
SUT VIC AB 2-0 SH 27 (SUTURE) ×8
SUT VIC AB 2-0 SH 27XBRD (SUTURE) IMPLANT
SUT VIC AB 3-0 SH 27 (SUTURE) ×8
SUT VIC AB 3-0 SH 27X BRD (SUTURE) ×2 IMPLANT
SYR BULB 3OZ (MISCELLANEOUS) ×4 IMPLANT
SYR CONTROL 10ML LL (SYRINGE) ×4 IMPLANT
SYR TB 1ML LL NO SAFETY (SYRINGE) ×4 IMPLANT
TOWEL GREEN STERILE FF (TOWEL DISPOSABLE) ×4 IMPLANT
TOWEL OR NON WOVEN STRL DISP B (DISPOSABLE) ×4 IMPLANT
TUBE CONNECTING 20'X1/4 (TUBING) ×1
TUBE CONNECTING 20X1/4 (TUBING) ×3 IMPLANT
UNDERPAD 30X30 (UNDERPADS AND DIAPERS) ×4 IMPLANT
YANKAUER SUCT BULB TIP NO VENT (SUCTIONS) ×4 IMPLANT

## 2018-09-16 NOTE — Anesthesia Postprocedure Evaluation (Signed)
Anesthesia Post Note  Patient: Elaine Harper  Procedure(s) Performed: WIDE LOCAL EXCISION WITH ADVANCEMENT FLAP CLOSURE (N/A ) SENTINEL NODE BIOPSY OF RIGHT LOWER LEG MELANOMA (Right )     Patient location during evaluation: PACU Anesthesia Type: General Level of consciousness: awake and alert, oriented and awake Pain management: pain level controlled Vital Signs Assessment: post-procedure vital signs reviewed and stable Respiratory status: spontaneous breathing, nonlabored ventilation and respiratory function stable Cardiovascular status: blood pressure returned to baseline and stable Postop Assessment: no apparent nausea or vomiting Anesthetic complications: no    Last Vitals:  Vitals:   09/16/18 1409 09/16/18 1439  BP:  (!) 146/78  Pulse: 72 75  Resp: 14   Temp:  36.5 C  SpO2: 100% 99%    Last Pain:  Vitals:   09/16/18 1439  PainSc: 5                  Catalina Gravel

## 2018-09-16 NOTE — Op Note (Signed)
PRE-OPERATIVE DIAGNOSIS: cT2N0 right lower leg melanoma  POST-OPERATIVE DIAGNOSIS:  Same  PROCEDURE:  Procedure(s): Wide local excision 1-2 cm margins, advancement flap closure for defect 3.1 cm x 6.4 cm, right inguinal sentinel lymph node mapping and biopsy  SURGEON:  Surgeon(s): Stark Klein, MD  ANESTHESIA:   local and general  DRAINS: none   LOCAL MEDICATIONS USED:  MARCAINE    and XYLOCAINE   SPECIMEN:  Source of Specimen:  1 right inguinal sentinel lymph node, wide local excision right leg melanoma  FINDINGS:  No gross residual disease.  1 LN hot and blue  DISPOSITION OF SPECIMEN:  PATHOLOGY  COUNTS:  YES  PLAN OF CARE: Discharge to home after PACU  PATIENT DISPOSITION:  PACU - hemodynamically stable.   PROCEDURE:   Pt was identified in the holding area, taken to the OR, and placed supine on the OR table.  General anesthesia was induced.  Time out was performed according to the surgical safety checklist.  When all was correct, we continued.  One mL methylene blue was injected intradermally around the melanoma biopsy site.    The patient's right leg and groin were prepped and draped in sterile fashion.  The point of maximum signal intensity was identified with the neoprobe.  A 4 cm incision was made with a #15 blade.  The subcutaneous tissues were divided with the cautery.  A Weitlaner retractor was used to assist with visualization.  The tonsil clamp was used to bluntly dissect the fat pad.  One deep sentinel lymph nodes were identified as described above.  The lymphovascular channels were clipped with hemoclips.  The nodes were passed off as specimens.  Hemostasis was achieved with the cautery.  The axilla was irrigated and closed with 3-0 Vicryl deep dermal interrupted sutures and 4-0 Monocryl running subcuticular suture.  This was cleaned, dried, and dressed with dermabond.  Counts were correct.  The melanoma was identified and 1-2 cm margins were marked out.  An ellipse was  created.  Local was administered under the melanoma and the adjacent tissue.  A #10 blade was used to incise the skin around the melanoma.  The cautery was used to take the dissection down to the fascia.  The skin was marked in situ with silk suture.  The cautery was used to take the specimen off the fascia, and it was passed off the table.    Skin hooks were used to elevate the edges of the incision and the skin was freed up in all directions to create skin flaps.  This was pulled together in a vertical orientation.   Deep interrupted 2-0 vicryl sutures were placed to relieve tension.  The skin was then reapproximated with 3-0 interrupted vicryl deep dermal sutures and 4-0 monocryl running subcuticular sutures.  2-0 nylon horizontal mattress sutures were placed as well.  The wound was dressed with Benzoin, steristrips, gauze, and tegaderm.    Needle, sponge, and instrument counts were correct.  The patient was awakened from anesthesia and taken to the PACU in stable condition.

## 2018-09-16 NOTE — Anesthesia Procedure Notes (Signed)
Procedure Name: LMA Insertion Date/Time: 09/16/2018 12:35 PM Performed by: Raenette Rover, CRNA Pre-anesthesia Checklist: Patient identified, Emergency Drugs available, Suction available and Patient being monitored Patient Re-evaluated:Patient Re-evaluated prior to induction Oxygen Delivery Method: Circle system utilized Preoxygenation: Pre-oxygenation with 100% oxygen Induction Type: IV induction LMA: LMA inserted LMA Size: 4.0 Placement Confirmation: positive ETCO2,  CO2 detector and breath sounds checked- equal and bilateral Tube secured with: Tape Dental Injury: Teeth and Oropharynx as per pre-operative assessment

## 2018-09-16 NOTE — Discharge Instructions (Addendum)
[facility] Office Phone Number [phone]   POST OP INSTRUCTIONS  Always review your discharge instruction sheet given to you by the facility where your surgery was performed.  IF YOU HAVE DISABILITY OR FAMILY LEAVE FORMS, YOU MUST BRING THEM TO THE OFFICE FOR PROCESSING.  DO NOT GIVE THEM TO YOUR DOCTOR.  1. A prescription for pain medication may be given to you upon discharge.  Take your pain medication as prescribed, if needed.  If narcotic pain medicine is not needed, then you may take acetaminophen (Tylenol) or ibuprofen (Advil) as needed. 2. Take your usually prescribed medications unless otherwise directed 3. If you need a refill on your pain medication, please contact your pharmacy.  They will contact our office to request authorization.  Prescriptions will not be filled after 5pm or on week-ends. 4. You should eat very light the first 24 hours after surgery, such as soup, crackers, pudding, etc.  Resume your normal diet the day after surgery 5. It is common to experience some constipation if taking pain medication after surgery.  Increasing fluid intake and taking a stool softener will usually help or prevent this problem from occurring.  A mild laxative (Milk of Magnesia or Miralax) should be taken according to package directions if there are no bowel movements after 48 hours. 6. You may shower in 48 hours.  The surgical glue will flake off in 2-3 weeks.   7. ACTIVITIES:  No strenuous activity or heavy lifting for 1 week.   a. You may drive when you no longer are taking prescription pain medication, you can comfortably wear a seatbelt, and you can safely maneuver your car and apply brakes. b. RETURN TO WORK:  __________9-4 weeks if applicable.  _______________ Dennis Bast should see your doctor in the office for a follow-up appointment approximately three-four weeks after your surgery.    WHEN TO CALL YOUR DOCTOR: 1. Fever over 101.0 2. Nausea and/or vomiting. 3. Extreme  swelling or bruising. 4. Continued bleeding from incision. 5. Increased pain, redness, or drainage from the incision.  The clinic staff is available to answer your questions during regular business hours.  Please dont hesitate to call and ask to speak to one of the nurses for clinical concerns.  If you have a medical emergency, go to the nearest emergency room or call 911.  A surgeon from [facility] is always on call at the hospital.  For further questions, please visit [email or web address]        Post Anesthesia Home Care Instructions  Activity: Get plenty of rest for the remainder of the day. A responsible individual must stay with you for 24 hours following the procedure.  For the next 24 hours, DO NOT: -Drive a car -Paediatric nurse -Drink alcoholic beverages -Take any medication unless instructed by your physician -Make any legal decisions or sign important papers.  Meals: Start with liquid foods such as gelatin or soup. Progress to regular foods as tolerated. Avoid greasy, spicy, heavy foods. If nausea and/or vomiting occur, drink only clear liquids until the nausea and/or vomiting subsides. Call your physician if vomiting continues.  Special Instructions/Symptoms: Your throat may feel dry or sore from the anesthesia or the breathing tube placed in your throat during surgery. If this causes discomfort, gargle with warm salt water. The discomfort should disappear within 24 hours.  If you had a scopolamine patch placed behind your ear for the management of post- operative nausea and/or vomiting:  1. The medication in the patch is  effective for 72 hours, after which it should be removed.  Wrap patch in a tissue and discard in the trash. Wash hands thoroughly with soap and water. 2. You may remove the patch earlier than 72 hours if you experience unpleasant side effects which may include dry mouth, dizziness or visual disturbances. 3. Avoid touching the  patch. Wash your hands with soap and water after contact with the patch.

## 2018-09-16 NOTE — Transfer of Care (Signed)
Immediate Anesthesia Transfer of Care Note  Patient: Elaine Harper  Procedure(s) Performed: WIDE LOCAL EXCISION WITH ADVANCEMENT FLAP CLOSURE (N/A ) SENTINEL NODE BIOPSY OF RIGHT LOWER LEG MELANOMA (Right )  Patient Location: PACU  Anesthesia Type:General  Level of Consciousness: awake, alert , oriented and patient cooperative  Airway & Oxygen Therapy: Patient Spontanous Breathing and Patient connected to face mask oxygen  Post-op Assessment: Report given to RN and Post -op Vital signs reviewed and stable  Post vital signs: Reviewed and stable  Last Vitals:  Vitals Value Taken Time  BP 125/62 09/16/2018  1:45 PM  Temp    Pulse 70 09/16/2018  1:46 PM  Resp 15 09/16/2018  1:46 PM  SpO2 100 % 09/16/2018  1:46 PM  Vitals shown include unvalidated device data.  Last Pain:  Vitals:   09/16/18 1145  PainSc: 0-No pain         Complications: No apparent anesthesia complications

## 2018-09-16 NOTE — Interval H&P Note (Signed)
History and Physical Interval Note:  09/16/2018 12:14 PM  Elaine Harper  has presented today for surgery, with the diagnosis of Malignnt melanoma, Rt lower leg  The various methods of treatment have been discussed with the patient and family. After consideration of risks, benefits and other options for treatment, the patient has consented to  Procedure(s): WIDE LOCAL EXCISION WITH ADVANCEMENT FLAP CLOSURE (N/A) SENTINEL NODE BIOPSY OF RIGHT LOWER LEG MELANOMA (Right) as a surgical intervention .  The patient's history has been reviewed, patient examined, no change in status, stable for surgery.  I have reviewed the patient's chart and labs.  Questions were answered to the patient's satisfaction.     Stark Klein

## 2018-09-16 NOTE — Anesthesia Preprocedure Evaluation (Signed)
Anesthesia Evaluation  Patient identified by MRN, date of birth, ID band Patient awake    Reviewed: Allergy & Precautions, NPO status , Patient's Chart, lab work & pertinent test results  Airway Mallampati: II  TM Distance: >3 FB Neck ROM: Full    Dental  (+) Teeth Intact, Dental Advisory Given   Pulmonary former smoker,    Pulmonary exam normal breath sounds clear to auscultation       Cardiovascular hypertension, Pt. on medications Normal cardiovascular exam Rhythm:Regular Rate:Normal     Neuro/Psych PSYCHIATRIC DISORDERS Anxiety negative neurological ROS     GI/Hepatic negative GI ROS, Neg liver ROS,   Endo/Other  negative endocrine ROS  Renal/GU negative Renal ROS     Musculoskeletal negative musculoskeletal ROS (+)   Abdominal   Peds  Hematology negative hematology ROS (+)   Anesthesia Other Findings Day of surgery medications reviewed with the patient.  Reproductive/Obstetrics                             Anesthesia Physical Anesthesia Plan  ASA: II  Anesthesia Plan: General   Post-op Pain Management:    Induction: Intravenous  PONV Risk Score and Plan: 3 and Midazolam, Dexamethasone and Ondansetron  Airway Management Planned: LMA  Additional Equipment:   Intra-op Plan:   Post-operative Plan: Extubation in OR  Informed Consent: I have reviewed the patients History and Physical, chart, labs and discussed the procedure including the risks, benefits and alternatives for the proposed anesthesia with the patient or authorized representative who has indicated his/her understanding and acceptance.   Dental advisory given  Plan Discussed with: CRNA  Anesthesia Plan Comments:         Anesthesia Quick Evaluation

## 2018-09-17 ENCOUNTER — Encounter (HOSPITAL_BASED_OUTPATIENT_CLINIC_OR_DEPARTMENT_OTHER): Payer: Self-pay | Admitting: General Surgery

## 2018-09-21 NOTE — Progress Notes (Signed)
Please let patient know no residual invasive melanoma, margins negative, lymph node negative.

## 2018-11-08 DIAGNOSIS — I10 Essential (primary) hypertension: Secondary | ICD-10-CM | POA: Diagnosis not present

## 2018-11-08 DIAGNOSIS — I471 Supraventricular tachycardia: Secondary | ICD-10-CM | POA: Diagnosis not present

## 2018-11-08 DIAGNOSIS — Z79899 Other long term (current) drug therapy: Secondary | ICD-10-CM | POA: Diagnosis not present

## 2018-11-26 DIAGNOSIS — L905 Scar conditions and fibrosis of skin: Secondary | ICD-10-CM | POA: Diagnosis not present

## 2018-11-26 DIAGNOSIS — Z85828 Personal history of other malignant neoplasm of skin: Secondary | ICD-10-CM | POA: Diagnosis not present

## 2018-11-26 DIAGNOSIS — Z85820 Personal history of malignant melanoma of skin: Secondary | ICD-10-CM | POA: Diagnosis not present

## 2019-01-24 MED FILL — LORazepam 1 MG TABS: 1 | 30 days supply | Qty: 30 | Fill #0

## 2019-02-05 MED FILL — SPIRONOLACTONE 50 MG TABS: 50 | 30 days supply | Qty: 30 | Fill #0

## 2019-02-10 DIAGNOSIS — L570 Actinic keratosis: Secondary | ICD-10-CM | POA: Diagnosis not present

## 2019-02-10 DIAGNOSIS — Z85828 Personal history of other malignant neoplasm of skin: Secondary | ICD-10-CM | POA: Diagnosis not present

## 2019-02-10 DIAGNOSIS — Z85820 Personal history of malignant melanoma of skin: Secondary | ICD-10-CM | POA: Diagnosis not present

## 2019-02-10 DIAGNOSIS — C44519 Basal cell carcinoma of skin of other part of trunk: Secondary | ICD-10-CM | POA: Diagnosis not present

## 2019-02-11 MED FILL — FLUOROURACIL 5 % CREA: 5 | 30 days supply | Qty: 40 | Fill #0

## 2019-03-03 DIAGNOSIS — Z90710 Acquired absence of both cervix and uterus: Secondary | ICD-10-CM | POA: Diagnosis not present

## 2019-03-03 DIAGNOSIS — Z79890 Hormone replacement therapy (postmenopausal): Secondary | ICD-10-CM | POA: Diagnosis not present

## 2019-03-07 MED FILL — SPIRONOLACTONE 50 MG TABS: 50 | 30 days supply | Qty: 30 | Fill #0

## 2019-03-18 MED FILL — LORazepam 1 MG TABS: 1 | 30 days supply | Qty: 30 | Fill #1

## 2019-04-14 MED FILL — SPIRONOLACTONE 50 MG TABS: 50 | 30 days supply | Qty: 30 | Fill #1

## 2019-05-17 ENCOUNTER — Other Ambulatory Visit: Payer: Self-pay | Admitting: Geriatric Medicine

## 2019-05-17 DIAGNOSIS — M542 Cervicalgia: Secondary | ICD-10-CM

## 2019-05-29 ENCOUNTER — Other Ambulatory Visit: Payer: 59

## 2019-06-04 ENCOUNTER — Other Ambulatory Visit: Payer: 59

## 2019-06-14 DIAGNOSIS — C44619 Basal cell carcinoma of skin of left upper limb, including shoulder: Secondary | ICD-10-CM | POA: Diagnosis not present

## 2019-06-17 DIAGNOSIS — M542 Cervicalgia: Secondary | ICD-10-CM | POA: Diagnosis not present

## 2019-06-17 DIAGNOSIS — M2560 Stiffness of unspecified joint, not elsewhere classified: Secondary | ICD-10-CM | POA: Diagnosis not present

## 2019-06-17 DIAGNOSIS — M6281 Muscle weakness (generalized): Secondary | ICD-10-CM | POA: Diagnosis not present

## 2019-06-17 DIAGNOSIS — M5412 Radiculopathy, cervical region: Secondary | ICD-10-CM | POA: Diagnosis not present

## 2019-06-20 DIAGNOSIS — M6281 Muscle weakness (generalized): Secondary | ICD-10-CM | POA: Diagnosis not present

## 2019-06-20 DIAGNOSIS — M2560 Stiffness of unspecified joint, not elsewhere classified: Secondary | ICD-10-CM | POA: Diagnosis not present

## 2019-06-20 DIAGNOSIS — M5412 Radiculopathy, cervical region: Secondary | ICD-10-CM | POA: Diagnosis not present

## 2019-06-20 DIAGNOSIS — M542 Cervicalgia: Secondary | ICD-10-CM | POA: Diagnosis not present

## 2019-06-24 DIAGNOSIS — M6281 Muscle weakness (generalized): Secondary | ICD-10-CM | POA: Diagnosis not present

## 2019-06-24 DIAGNOSIS — M542 Cervicalgia: Secondary | ICD-10-CM | POA: Diagnosis not present

## 2019-06-24 DIAGNOSIS — M2560 Stiffness of unspecified joint, not elsewhere classified: Secondary | ICD-10-CM | POA: Diagnosis not present

## 2019-06-24 DIAGNOSIS — M5412 Radiculopathy, cervical region: Secondary | ICD-10-CM | POA: Diagnosis not present

## 2019-06-27 DIAGNOSIS — M5412 Radiculopathy, cervical region: Secondary | ICD-10-CM | POA: Diagnosis not present

## 2019-06-27 DIAGNOSIS — M6281 Muscle weakness (generalized): Secondary | ICD-10-CM | POA: Diagnosis not present

## 2019-06-27 DIAGNOSIS — M542 Cervicalgia: Secondary | ICD-10-CM | POA: Diagnosis not present

## 2019-06-27 DIAGNOSIS — M2560 Stiffness of unspecified joint, not elsewhere classified: Secondary | ICD-10-CM | POA: Diagnosis not present

## 2019-07-01 DIAGNOSIS — M2560 Stiffness of unspecified joint, not elsewhere classified: Secondary | ICD-10-CM | POA: Diagnosis not present

## 2019-07-01 DIAGNOSIS — M6281 Muscle weakness (generalized): Secondary | ICD-10-CM | POA: Diagnosis not present

## 2019-07-01 DIAGNOSIS — M5412 Radiculopathy, cervical region: Secondary | ICD-10-CM | POA: Diagnosis not present

## 2019-07-01 DIAGNOSIS — M542 Cervicalgia: Secondary | ICD-10-CM | POA: Diagnosis not present

## 2019-07-05 DIAGNOSIS — M2560 Stiffness of unspecified joint, not elsewhere classified: Secondary | ICD-10-CM | POA: Diagnosis not present

## 2019-07-05 DIAGNOSIS — M542 Cervicalgia: Secondary | ICD-10-CM | POA: Diagnosis not present

## 2019-07-05 DIAGNOSIS — M5412 Radiculopathy, cervical region: Secondary | ICD-10-CM | POA: Diagnosis not present

## 2019-07-05 DIAGNOSIS — M6281 Muscle weakness (generalized): Secondary | ICD-10-CM | POA: Diagnosis not present

## 2019-07-07 DIAGNOSIS — M542 Cervicalgia: Secondary | ICD-10-CM | POA: Diagnosis not present

## 2019-07-07 DIAGNOSIS — M6281 Muscle weakness (generalized): Secondary | ICD-10-CM | POA: Diagnosis not present

## 2019-07-07 DIAGNOSIS — M2560 Stiffness of unspecified joint, not elsewhere classified: Secondary | ICD-10-CM | POA: Diagnosis not present

## 2019-07-07 DIAGNOSIS — M5412 Radiculopathy, cervical region: Secondary | ICD-10-CM | POA: Diagnosis not present

## 2019-07-12 DIAGNOSIS — M5412 Radiculopathy, cervical region: Secondary | ICD-10-CM | POA: Diagnosis not present

## 2019-07-12 DIAGNOSIS — M6281 Muscle weakness (generalized): Secondary | ICD-10-CM | POA: Diagnosis not present

## 2019-07-12 DIAGNOSIS — M2560 Stiffness of unspecified joint, not elsewhere classified: Secondary | ICD-10-CM | POA: Diagnosis not present

## 2019-07-12 DIAGNOSIS — M542 Cervicalgia: Secondary | ICD-10-CM | POA: Diagnosis not present

## 2019-07-14 DIAGNOSIS — M542 Cervicalgia: Secondary | ICD-10-CM | POA: Diagnosis not present

## 2019-07-14 DIAGNOSIS — M2560 Stiffness of unspecified joint, not elsewhere classified: Secondary | ICD-10-CM | POA: Diagnosis not present

## 2019-07-14 DIAGNOSIS — M5412 Radiculopathy, cervical region: Secondary | ICD-10-CM | POA: Diagnosis not present

## 2019-07-14 DIAGNOSIS — M6281 Muscle weakness (generalized): Secondary | ICD-10-CM | POA: Diagnosis not present

## 2019-07-18 DIAGNOSIS — M6281 Muscle weakness (generalized): Secondary | ICD-10-CM | POA: Diagnosis not present

## 2019-07-18 DIAGNOSIS — M2560 Stiffness of unspecified joint, not elsewhere classified: Secondary | ICD-10-CM | POA: Diagnosis not present

## 2019-07-18 DIAGNOSIS — M5412 Radiculopathy, cervical region: Secondary | ICD-10-CM | POA: Diagnosis not present

## 2019-07-18 DIAGNOSIS — M542 Cervicalgia: Secondary | ICD-10-CM | POA: Diagnosis not present

## 2019-07-19 DIAGNOSIS — H2513 Age-related nuclear cataract, bilateral: Secondary | ICD-10-CM | POA: Diagnosis not present

## 2019-07-19 DIAGNOSIS — H524 Presbyopia: Secondary | ICD-10-CM | POA: Diagnosis not present

## 2019-07-19 DIAGNOSIS — H5213 Myopia, bilateral: Secondary | ICD-10-CM | POA: Diagnosis not present

## 2019-07-21 DIAGNOSIS — M6281 Muscle weakness (generalized): Secondary | ICD-10-CM | POA: Diagnosis not present

## 2019-07-21 DIAGNOSIS — M5412 Radiculopathy, cervical region: Secondary | ICD-10-CM | POA: Diagnosis not present

## 2019-07-21 DIAGNOSIS — M542 Cervicalgia: Secondary | ICD-10-CM | POA: Diagnosis not present

## 2019-07-21 DIAGNOSIS — M2560 Stiffness of unspecified joint, not elsewhere classified: Secondary | ICD-10-CM | POA: Diagnosis not present

## 2019-07-25 DIAGNOSIS — M6281 Muscle weakness (generalized): Secondary | ICD-10-CM | POA: Diagnosis not present

## 2019-07-25 DIAGNOSIS — M542 Cervicalgia: Secondary | ICD-10-CM | POA: Diagnosis not present

## 2019-07-25 DIAGNOSIS — M5412 Radiculopathy, cervical region: Secondary | ICD-10-CM | POA: Diagnosis not present

## 2019-07-25 DIAGNOSIS — M2560 Stiffness of unspecified joint, not elsewhere classified: Secondary | ICD-10-CM | POA: Diagnosis not present

## 2019-08-23 DIAGNOSIS — L570 Actinic keratosis: Secondary | ICD-10-CM | POA: Diagnosis not present

## 2019-08-23 DIAGNOSIS — Z85820 Personal history of malignant melanoma of skin: Secondary | ICD-10-CM | POA: Diagnosis not present

## 2019-08-23 DIAGNOSIS — Z85828 Personal history of other malignant neoplasm of skin: Secondary | ICD-10-CM | POA: Diagnosis not present

## 2019-08-23 DIAGNOSIS — L820 Inflamed seborrheic keratosis: Secondary | ICD-10-CM | POA: Diagnosis not present

## 2019-09-14 DIAGNOSIS — R252 Cramp and spasm: Secondary | ICD-10-CM | POA: Diagnosis not present

## 2019-09-14 DIAGNOSIS — Z79899 Other long term (current) drug therapy: Secondary | ICD-10-CM | POA: Diagnosis not present

## 2019-09-14 DIAGNOSIS — I10 Essential (primary) hypertension: Secondary | ICD-10-CM | POA: Diagnosis not present

## 2019-09-14 DIAGNOSIS — Z23 Encounter for immunization: Secondary | ICD-10-CM | POA: Diagnosis not present

## 2019-09-14 DIAGNOSIS — R202 Paresthesia of skin: Secondary | ICD-10-CM | POA: Diagnosis not present

## 2019-10-28 HISTORY — PX: MICRODISCECTOMY LUMBAR: SUR864

## 2019-11-09 DIAGNOSIS — B351 Tinea unguium: Secondary | ICD-10-CM | POA: Diagnosis not present

## 2019-11-09 DIAGNOSIS — B353 Tinea pedis: Secondary | ICD-10-CM | POA: Diagnosis not present

## 2019-11-09 DIAGNOSIS — L570 Actinic keratosis: Secondary | ICD-10-CM | POA: Diagnosis not present

## 2019-11-09 DIAGNOSIS — C44719 Basal cell carcinoma of skin of left lower limb, including hip: Secondary | ICD-10-CM | POA: Diagnosis not present

## 2019-11-09 DIAGNOSIS — Z85828 Personal history of other malignant neoplasm of skin: Secondary | ICD-10-CM | POA: Diagnosis not present

## 2019-11-09 DIAGNOSIS — Z85820 Personal history of malignant melanoma of skin: Secondary | ICD-10-CM | POA: Diagnosis not present

## 2019-11-23 DIAGNOSIS — C44719 Basal cell carcinoma of skin of left lower limb, including hip: Secondary | ICD-10-CM | POA: Diagnosis not present

## 2020-01-20 DIAGNOSIS — R252 Cramp and spasm: Secondary | ICD-10-CM | POA: Diagnosis not present

## 2020-01-20 DIAGNOSIS — I10 Essential (primary) hypertension: Secondary | ICD-10-CM | POA: Diagnosis not present

## 2020-01-20 DIAGNOSIS — I471 Supraventricular tachycardia: Secondary | ICD-10-CM | POA: Diagnosis not present

## 2020-02-08 DIAGNOSIS — L821 Other seborrheic keratosis: Secondary | ICD-10-CM | POA: Diagnosis not present

## 2020-02-08 DIAGNOSIS — Z85828 Personal history of other malignant neoplasm of skin: Secondary | ICD-10-CM | POA: Diagnosis not present

## 2020-02-08 DIAGNOSIS — L570 Actinic keratosis: Secondary | ICD-10-CM | POA: Diagnosis not present

## 2020-02-08 DIAGNOSIS — Z85820 Personal history of malignant melanoma of skin: Secondary | ICD-10-CM | POA: Diagnosis not present

## 2020-02-08 DIAGNOSIS — D1801 Hemangioma of skin and subcutaneous tissue: Secondary | ICD-10-CM | POA: Diagnosis not present

## 2020-03-01 DIAGNOSIS — Z79899 Other long term (current) drug therapy: Secondary | ICD-10-CM | POA: Diagnosis not present

## 2020-03-29 DIAGNOSIS — Z79890 Hormone replacement therapy (postmenopausal): Secondary | ICD-10-CM | POA: Diagnosis not present

## 2020-03-29 DIAGNOSIS — Z1231 Encounter for screening mammogram for malignant neoplasm of breast: Secondary | ICD-10-CM | POA: Diagnosis not present

## 2020-05-10 DIAGNOSIS — I471 Supraventricular tachycardia: Secondary | ICD-10-CM | POA: Diagnosis not present

## 2020-05-10 DIAGNOSIS — Z79899 Other long term (current) drug therapy: Secondary | ICD-10-CM | POA: Diagnosis not present

## 2020-05-10 DIAGNOSIS — I10 Essential (primary) hypertension: Secondary | ICD-10-CM | POA: Diagnosis not present

## 2020-05-10 DIAGNOSIS — Z0000 Encounter for general adult medical examination without abnormal findings: Secondary | ICD-10-CM | POA: Diagnosis not present

## 2020-05-10 DIAGNOSIS — E559 Vitamin D deficiency, unspecified: Secondary | ICD-10-CM | POA: Diagnosis not present

## 2020-05-10 DIAGNOSIS — Z1159 Encounter for screening for other viral diseases: Secondary | ICD-10-CM | POA: Diagnosis not present

## 2020-06-12 DIAGNOSIS — R8290 Unspecified abnormal findings in urine: Secondary | ICD-10-CM | POA: Diagnosis not present

## 2020-07-11 ENCOUNTER — Other Ambulatory Visit (HOSPITAL_COMMUNITY): Payer: Self-pay | Admitting: Dermatology

## 2020-08-16 ENCOUNTER — Other Ambulatory Visit (HOSPITAL_COMMUNITY): Payer: Self-pay | Admitting: Geriatric Medicine

## 2020-09-24 DIAGNOSIS — Z20822 Contact with and (suspected) exposure to covid-19: Secondary | ICD-10-CM | POA: Diagnosis not present

## 2020-10-08 ENCOUNTER — Other Ambulatory Visit (HOSPITAL_COMMUNITY): Payer: Self-pay | Admitting: Dermatology

## 2020-10-08 DIAGNOSIS — M5441 Lumbago with sciatica, right side: Secondary | ICD-10-CM | POA: Diagnosis not present

## 2020-11-07 DIAGNOSIS — L821 Other seborrheic keratosis: Secondary | ICD-10-CM | POA: Diagnosis not present

## 2020-11-07 DIAGNOSIS — L249 Irritant contact dermatitis, unspecified cause: Secondary | ICD-10-CM | POA: Diagnosis not present

## 2020-11-07 DIAGNOSIS — Z85828 Personal history of other malignant neoplasm of skin: Secondary | ICD-10-CM | POA: Diagnosis not present

## 2020-11-07 DIAGNOSIS — D485 Neoplasm of uncertain behavior of skin: Secondary | ICD-10-CM | POA: Diagnosis not present

## 2020-11-07 DIAGNOSIS — Z85820 Personal history of malignant melanoma of skin: Secondary | ICD-10-CM | POA: Diagnosis not present

## 2020-11-07 DIAGNOSIS — L820 Inflamed seborrheic keratosis: Secondary | ICD-10-CM | POA: Diagnosis not present

## 2020-11-07 DIAGNOSIS — L570 Actinic keratosis: Secondary | ICD-10-CM | POA: Diagnosis not present

## 2020-11-07 DIAGNOSIS — D225 Melanocytic nevi of trunk: Secondary | ICD-10-CM | POA: Diagnosis not present

## 2020-11-07 DIAGNOSIS — L814 Other melanin hyperpigmentation: Secondary | ICD-10-CM | POA: Diagnosis not present

## 2020-11-07 DIAGNOSIS — D1801 Hemangioma of skin and subcutaneous tissue: Secondary | ICD-10-CM | POA: Diagnosis not present

## 2020-11-12 DIAGNOSIS — G8929 Other chronic pain: Secondary | ICD-10-CM | POA: Diagnosis not present

## 2020-11-12 DIAGNOSIS — I10 Essential (primary) hypertension: Secondary | ICD-10-CM | POA: Diagnosis not present

## 2020-11-12 DIAGNOSIS — M5441 Lumbago with sciatica, right side: Secondary | ICD-10-CM | POA: Diagnosis not present

## 2020-11-15 DIAGNOSIS — I10 Essential (primary) hypertension: Secondary | ICD-10-CM | POA: Diagnosis not present

## 2020-12-06 ENCOUNTER — Other Ambulatory Visit: Payer: Self-pay | Admitting: Geriatric Medicine

## 2020-12-06 DIAGNOSIS — M545 Low back pain, unspecified: Secondary | ICD-10-CM

## 2020-12-19 DIAGNOSIS — H2513 Age-related nuclear cataract, bilateral: Secondary | ICD-10-CM | POA: Diagnosis not present

## 2020-12-19 DIAGNOSIS — H5213 Myopia, bilateral: Secondary | ICD-10-CM | POA: Diagnosis not present

## 2020-12-21 ENCOUNTER — Other Ambulatory Visit (HOSPITAL_COMMUNITY): Payer: Self-pay | Admitting: Geriatric Medicine

## 2020-12-21 ENCOUNTER — Other Ambulatory Visit: Payer: Self-pay

## 2020-12-21 ENCOUNTER — Ambulatory Visit
Admission: RE | Admit: 2020-12-21 | Discharge: 2020-12-21 | Disposition: A | Discharge status: Home / Self Care | Payer: Medicare Other | Source: Ambulatory Visit | Attending: Geriatric Medicine | Admitting: Geriatric Medicine

## 2020-12-21 DIAGNOSIS — M545 Low back pain, unspecified: Secondary | ICD-10-CM

## 2020-12-31 DIAGNOSIS — M5441 Lumbago with sciatica, right side: Secondary | ICD-10-CM | POA: Diagnosis not present

## 2021-01-02 DIAGNOSIS — M5417 Radiculopathy, lumbosacral region: Secondary | ICD-10-CM | POA: Diagnosis not present

## 2021-01-02 DIAGNOSIS — M5137 Other intervertebral disc degeneration, lumbosacral region: Secondary | ICD-10-CM | POA: Diagnosis not present

## 2021-01-17 DIAGNOSIS — M5417 Radiculopathy, lumbosacral region: Secondary | ICD-10-CM | POA: Diagnosis not present

## 2021-01-28 ENCOUNTER — Other Ambulatory Visit (HOSPITAL_COMMUNITY): Payer: Self-pay

## 2021-01-29 ENCOUNTER — Other Ambulatory Visit (HOSPITAL_COMMUNITY): Payer: Self-pay

## 2021-01-29 MED ORDER — LORAZEPAM 1 MG PO TABS
1.0000 mg | ORAL_TABLET | Freq: Every evening | ORAL | 0 refills | Status: DC
Start: 2021-01-29 — End: 2021-04-16
  Filled 2021-01-29: qty 30, 30d supply, fill #0

## 2021-02-05 ENCOUNTER — Other Ambulatory Visit (HOSPITAL_COMMUNITY): Payer: Self-pay

## 2021-02-11 DIAGNOSIS — M5137 Other intervertebral disc degeneration, lumbosacral region: Secondary | ICD-10-CM | POA: Diagnosis not present

## 2021-02-11 DIAGNOSIS — M5417 Radiculopathy, lumbosacral region: Secondary | ICD-10-CM | POA: Diagnosis not present

## 2021-02-22 DIAGNOSIS — M47817 Spondylosis without myelopathy or radiculopathy, lumbosacral region: Secondary | ICD-10-CM | POA: Diagnosis not present

## 2021-02-22 DIAGNOSIS — M5127 Other intervertebral disc displacement, lumbosacral region: Secondary | ICD-10-CM | POA: Diagnosis not present

## 2021-02-22 DIAGNOSIS — M5417 Radiculopathy, lumbosacral region: Secondary | ICD-10-CM | POA: Diagnosis not present

## 2021-02-22 DIAGNOSIS — I10 Essential (primary) hypertension: Secondary | ICD-10-CM | POA: Diagnosis not present

## 2021-02-22 DIAGNOSIS — M4316 Spondylolisthesis, lumbar region: Secondary | ICD-10-CM | POA: Diagnosis not present

## 2021-03-11 ENCOUNTER — Other Ambulatory Visit (HOSPITAL_COMMUNITY): Payer: Self-pay

## 2021-03-11 DIAGNOSIS — M5117 Intervertebral disc disorders with radiculopathy, lumbosacral region: Secondary | ICD-10-CM | POA: Diagnosis not present

## 2021-03-11 MED ORDER — OXYCODONE-ACETAMINOPHEN 5-325 MG PO TABS
ORAL_TABLET | ORAL | 0 refills | Status: DC
  Filled 2021-03-11: qty 30, 5d supply, fill #0

## 2021-03-11 MED ORDER — CYCLOBENZAPRINE HCL 5 MG PO TABS
ORAL_TABLET | ORAL | 0 refills | Status: DC
  Filled 2021-03-11: qty 50, 17d supply, fill #0

## 2021-03-27 ENCOUNTER — Other Ambulatory Visit (HOSPITAL_COMMUNITY): Payer: Self-pay

## 2021-03-27 MED ORDER — METHYLPREDNISOLONE 4 MG PO TBPK
ORAL_TABLET | ORAL | 0 refills | Status: DC
  Filled 2021-03-27: qty 21, 6d supply, fill #0

## 2021-04-02 DIAGNOSIS — Z1231 Encounter for screening mammogram for malignant neoplasm of breast: Secondary | ICD-10-CM | POA: Diagnosis not present

## 2021-04-02 DIAGNOSIS — Z6824 Body mass index (BMI) 24.0-24.9, adult: Secondary | ICD-10-CM | POA: Diagnosis not present

## 2021-04-16 ENCOUNTER — Other Ambulatory Visit (HOSPITAL_COMMUNITY): Payer: Self-pay

## 2021-04-16 MED ORDER — LORAZEPAM 1 MG PO TABS
ORAL_TABLET | ORAL | 0 refills | Status: AC
  Filled 2021-04-16: qty 90, 90d supply, fill #0

## 2021-05-14 DIAGNOSIS — L570 Actinic keratosis: Secondary | ICD-10-CM | POA: Diagnosis not present

## 2021-05-14 DIAGNOSIS — D1801 Hemangioma of skin and subcutaneous tissue: Secondary | ICD-10-CM | POA: Diagnosis not present

## 2021-05-14 DIAGNOSIS — Z85820 Personal history of malignant melanoma of skin: Secondary | ICD-10-CM | POA: Diagnosis not present

## 2021-05-14 DIAGNOSIS — L814 Other melanin hyperpigmentation: Secondary | ICD-10-CM | POA: Diagnosis not present

## 2021-05-14 DIAGNOSIS — Z85828 Personal history of other malignant neoplasm of skin: Secondary | ICD-10-CM | POA: Diagnosis not present

## 2021-05-14 DIAGNOSIS — C44519 Basal cell carcinoma of skin of other part of trunk: Secondary | ICD-10-CM | POA: Diagnosis not present

## 2021-05-14 DIAGNOSIS — L821 Other seborrheic keratosis: Secondary | ICD-10-CM | POA: Diagnosis not present

## 2021-05-17 DIAGNOSIS — Z0000 Encounter for general adult medical examination without abnormal findings: Secondary | ICD-10-CM | POA: Diagnosis not present

## 2021-05-17 DIAGNOSIS — I10 Essential (primary) hypertension: Secondary | ICD-10-CM | POA: Diagnosis not present

## 2021-05-17 DIAGNOSIS — Z79899 Other long term (current) drug therapy: Secondary | ICD-10-CM | POA: Diagnosis not present

## 2021-05-17 DIAGNOSIS — E559 Vitamin D deficiency, unspecified: Secondary | ICD-10-CM | POA: Diagnosis not present

## 2021-07-16 DIAGNOSIS — M5417 Radiculopathy, lumbosacral region: Secondary | ICD-10-CM | POA: Diagnosis not present

## 2021-07-16 DIAGNOSIS — I10 Essential (primary) hypertension: Secondary | ICD-10-CM | POA: Diagnosis not present

## 2021-08-13 ENCOUNTER — Other Ambulatory Visit: Payer: Self-pay | Admitting: Student

## 2021-08-13 DIAGNOSIS — M5417 Radiculopathy, lumbosacral region: Secondary | ICD-10-CM

## 2021-08-19 ENCOUNTER — Ambulatory Visit
Admission: RE | Admit: 2021-08-19 | Discharge: 2021-08-19 | Disposition: A | Discharge status: Home / Self Care | Payer: Medicare Other | Source: Ambulatory Visit | Attending: Student | Admitting: Student

## 2021-08-19 ENCOUNTER — Other Ambulatory Visit: Payer: Self-pay

## 2021-08-19 DIAGNOSIS — M5116 Intervertebral disc disorders with radiculopathy, lumbar region: Secondary | ICD-10-CM | POA: Diagnosis not present

## 2021-08-19 DIAGNOSIS — M5117 Intervertebral disc disorders with radiculopathy, lumbosacral region: Secondary | ICD-10-CM | POA: Diagnosis not present

## 2021-08-19 DIAGNOSIS — M4727 Other spondylosis with radiculopathy, lumbosacral region: Secondary | ICD-10-CM | POA: Diagnosis not present

## 2021-08-19 DIAGNOSIS — M4726 Other spondylosis with radiculopathy, lumbar region: Secondary | ICD-10-CM | POA: Diagnosis not present

## 2021-08-19 DIAGNOSIS — M5417 Radiculopathy, lumbosacral region: Secondary | ICD-10-CM

## 2021-08-19 MED ORDER — GADOBENATE DIMEGLUMINE 529 MG/ML IV SOLN
14.0000 mL | Freq: Once | INTRAVENOUS | Status: AC | PRN
  Administered 2021-08-19: 14 mL via INTRAVENOUS

## 2021-08-26 DIAGNOSIS — I10 Essential (primary) hypertension: Secondary | ICD-10-CM | POA: Diagnosis not present

## 2021-08-26 DIAGNOSIS — M5442 Lumbago with sciatica, left side: Secondary | ICD-10-CM | POA: Diagnosis not present

## 2021-08-26 DIAGNOSIS — M4316 Spondylolisthesis, lumbar region: Secondary | ICD-10-CM | POA: Diagnosis not present

## 2021-08-26 DIAGNOSIS — G8929 Other chronic pain: Secondary | ICD-10-CM | POA: Diagnosis not present

## 2021-09-03 DIAGNOSIS — N390 Urinary tract infection, site not specified: Secondary | ICD-10-CM | POA: Diagnosis not present

## 2021-09-03 DIAGNOSIS — R300 Dysuria: Secondary | ICD-10-CM | POA: Diagnosis not present

## 2021-09-17 NOTE — Therapy (Signed)
OUTPATIENT PHYSICAL THERAPY THORACOLUMBAR EVALUATION   Patient Name: Elaine Harper MRN: [id number] DOB:[birth year], 65 y.o., female Today's Date: 09/18/2021   PT End of Session - 09/18/21 0953     Visit Number 1    Number of Visits 16    Date for PT Re-Evaluation 12/17/21    Authorization Type [employer] Medicare    PT Start Time 0845    PT Stop Time 0930    PT Time Calculation (min) 45 min    Activity Tolerance Patient tolerated treatment well    Behavior During Therapy WFL for tasks assessed/performed             Past Medical History:  Diagnosis Date   Anxiety    Complication of anesthesia    hard to put to sleep   Hypertension    Menopause    Past Surgical History:  Procedure Laterality Date   ABDOMINAL HYSTERECTOMY     ABLATION ON ENDOMETRIOSIS     LAPAROSCOPIC APPENDECTOMY     MELANOMA EXCISION N/A 09/16/2018   Procedure: WIDE LOCAL EXCISION WITH ADVANCEMENT FLAP CLOSURE;  Surgeon: Stark Klein, MD;  Location: [facility];  Service: General;  Laterality: N/A;   SENTINEL NODE BIOPSY Right 09/16/2018   Procedure: SENTINEL NODE BIOPSY OF RIGHT LOWER LEG MELANOMA;  Surgeon: Stark Klein, MD;  Location: [facility];  Service: General;  Laterality: Right;   There are no problems to display for this patient.   PCP: Lajean Manes, MD  REFERRING PROVIDER: Newman Pies, MD  REFERRING DIAG: [phone] (ICD-10-CM) - Lumbago with sciatica, left side   THERAPY DIAG:  Pain, lumbar region  Muscle weakness (generalized)  Difficulty walking  ONSET DATE: July 2022   SUBJECTIVE:                                                                                                                                                                                           SUBJECTIVE STATEMENT: Pt states she has different back pain from previous surgery at L5-S1. Pt states she now has L sided back pain with L anterior thigh pain/NT. 2 months after  surgery starting have L sided pain. The pain will start in back and go down the L buttock leg. It feels like a "terrible ache." Legs get tired with stairs. Pain starts at about 1.5 miles of walking. Pt denies legs giving out. Screening for red flags below.   PERTINENT HISTORY:  Discectomy and laminectomy on L5-S1 in May 2022  PAIN:  Are you having pain? No VAS scale: 0/10, Worst 10/10 Pain location: L low back Pain orientation: Right and Left  PAIN TYPE: aching Pain description: intermittent  Aggravating factors: walking, standing too long;  Relieving factors: resting, ibuprofen, ice, sitting  PRECAUTIONS: None  WEIGHT BEARING RESTRICTIONS No  FALLS:  Has patient fallen in last 6 months? No,   LIVING ENVIRONMENT: Lives with: lives with their family Lives in: House/apartment Stairs: Yes; 3 floors Has following equipment at home: None  OCCUPATION: retired  PLOF: Independent  PATIENT GOALS : Pt walks 3-4 miles a day for exercise. Pt would like to exercise without pain.    OBJECTIVE:   DIAGNOSTIC FINDINGS:  L5-S1:Postsurgical changes from right laminectomy and micro discectomy with interval resolution of the right subarticular extruded disc component. T1 hypointense, enhancing tissue in is now seen in right central/subarticular zone, consistent with postsurgical granulation tissue.No spinal canal or neural foraminal stenosis. Disc uncovering and prominent hypertrophic facet degenerative changes with and prominent left-sided ligamentum flavum redundancy resulting in mild left subarticular zone stenosis and moderate left neural foraminal narrowing, progressed since prior MRI.   IMPRESSION: 1. Postsurgical changes at L5-S1 with interval resolution of right subarticular disc extrusion, noting enhancement in this location, likely representing postsurgical granulation tissue. 2. Moderate left neural foraminal narrowing at L5-S1, progressed from prior MRI, potentially  impinging on the exiting left L5 nerve root.  PATIENT SURVEYS:  FOTO 56 69 at D/C 3pts MCII  SCREENING FOR RED FLAGS: Bowel or bladder incontinence: No Spinal tumors: No Cauda equina syndrome: No Compression fracture: No Abdominal aneurysm: No  COGNITION:  Overall cognitive status: Within functional limits for tasks assessed     SENSATION:  Light touch: Deficits on L    MUSCLE LENGTH: Hamstrings: bilateral limited in supine 90/90 30 deg  LUMBARAROM/PROM  A/PROM A/PROM  09/18/2021  Flexion WFL, L p!  Extension 50% L sided closing deficit  Right lateral flexion 60%  Left lateral flexion 75%  Right rotation [facility]  Left rotation WFL   (Blank rows = not tested)  LE AROM/PROM:  A/PROM Right 09/18/2021 Left 09/18/2021  Hip flexion [facility] WFL  Hip extension 0 0  Hip external rotation 75% 60%  Knee flexion [facility] WFL  Knee extension [facility] WFL  Ankle dorsiflexion WFL WFL   (Blank rows = not tested)  LE MMT:  MMT Right 09/18/2021 Left 09/18/2021  Hip flexion 4/5 4/5  Hip extension    Hip abduction 4/5 4/5  Hip adduction 4/5 4/5  Knee flexion 5/5 5/5  Knee extension 5/5 5/5  Ankle dorsiflexion 5/5 5/5   (Blank rows = not tested)  LUMBAR SPECIAL TESTS:  Straight leg raise test: Positive, Slump test: Positive, and FABER test: Positive  SPINAL SEGMENTAL MOBILITY ASSESSMENT:  L1-5 CPA and UPA stiffness  FUNCTIONAL TESTS:  5 times sit to stand: 13.5  (eccentric lowering control deficit) Squat: quarter depth, decreased eccentric control, quad dominant  GAIT: Distance walked: 35ft Assistive device utilized: None Level of assistance: Complete Independence Comments: decreased trunk rotation, exaggerated lumbar lordosis    TODAY'S TREATMENT   Exercises Supine Posterior Pelvic Tilt - 2 x daily - 7 x weekly - 2 sets - 10 reps - 2 hold Supine Lower Trunk Rotation - 2 x daily - 7 x weekly - 2 sets - 10 reps - 3 hold Supine Bridge - 2 x daily - 7 x weekly - 3 sets -  10 reps    PATIENT EDUCATION:  Education details: MOI, diagnosis, prognosis, anatomy, exercise progression, DOMS expectations, muscle firing,  envelope of function, HEP, POC  Person educated: Patient Education method: Explanation, Demonstration,  Tactile cues, Verbal cues, and Handouts Education comprehension: verbalized understanding, returned demonstration, verbal cues required, and tactile cues required   HOME EXERCISE PROGRAM:  Access Code: [email or web address] URL: [email or web address] Date: 09/18/2021 Prepared by: Daleen Bo  ASSESSMENT:  CLINICAL IMPRESSION: Patient is a 65 y.o. female who was seen today for physical therapy evaluation and treatment for CC of LBP. Pt s/s appear consistent with a lumbar radiculopathy and MRI showing lumbar for foraminal stenosis. Pt's pain also appears related to endurance and strength deficits of lumbar stabilizers and proximal hip motor control deficits. Pt's pain is moderately sensitive and irritable to palpation in clinic. Pt likely to do well with mobility and strength based program. Objective impairments include Abnormal gait, decreased activity tolerance, decreased endurance, difficulty walking, decreased ROM, decreased strength, hypomobility, increased fascial restrictions, increased muscle spasms, impaired flexibility, impaired sensation, improper body mechanics, postural dysfunction, and pain. These impairments are limiting patient from cleaning, community activity, yard work, shopping, and exercise . Personal factors including Age, Fitness, Past/current experiences, and Time since onset of injury/illness/exacerbation are also affecting patient's functional outcome. Patient will benefit from skilled PT to address above impairments and improve overall function.  REHAB POTENTIAL: Good  CLINICAL DECISION MAKING: Stable/uncomplicated  EVALUATION COMPLEXITY: Low   GOALS:   SHORT TERM GOALS:  STG Name Target Date Goal status  1 Pt will  become independent with HEP in order to demonstrate synthesis of PT education.   10/02/2021 INITIAL  2 Pt will report at least 2 pt reduction on NPRS scale for pain in order to demonstrate functional improvement with household activity, self care, and ADL.   10/30/2021 INITIAL  3 Pt will score at least 3 pt increase on FOTO to demonstrate functional improvement in MCII and pt perceived function.    10/30/2021 INITIAL   LONG TERM GOALS:   LTG Name Target Date Goal status  1 Pt  will become independent with final HEP in order to demonstrate synthesis of PT education.  12/11/2021 INITIAL  2 Pt will be able to demonstrate/report ability to walk >35 mins or 3 miles without pain in order to demonstrate functional improvement and tolerance to exercise and community mobility.   12/11/2021 INITIAL  3 Pt will score >/= 69 on FOTO to demonstrate functional improvement in LBP.   12/11/2021 INITIAL  4 Pt will be able to perform 5XSTS in under 12s  in order to demonstrate functional improvement above the cut off score for adults.   12/11/2021 INITIAL   PLAN: PT FREQUENCY: 1-2x/week  PT DURATION: 12 weeks (likely D/C by 8)  PLANNED INTERVENTIONS: Therapeutic exercises, Therapeutic activity, Neuro Muscular re-education, Balance training, Gait training, Patient/Family education, Joint mobilization, Stair training, Orthotic/Fit training, Aquatic Therapy, Dry Needling, Electrical stimulation, Spinal mobilization, Cryotherapy, Moist heat, scar mobilization, Taping, Vasopneumatic device, Traction, Ultrasound, and Ionotophoresis 4mg /ml Dexamethasone  PLAN FOR NEXT SESSION: review HEP, lumbar mobs, flexion stretching   Daleen Bo PT, DPT 09/18/21 9:54 AM

## 2021-09-18 ENCOUNTER — Encounter (HOSPITAL_BASED_OUTPATIENT_CLINIC_OR_DEPARTMENT_OTHER): Payer: Self-pay | Admitting: Physical Therapy

## 2021-09-18 ENCOUNTER — Other Ambulatory Visit: Payer: Self-pay

## 2021-09-18 ENCOUNTER — Ambulatory Visit (HOSPITAL_BASED_OUTPATIENT_CLINIC_OR_DEPARTMENT_OTHER): Payer: Medicare Other | Attending: Neurosurgery | Admitting: Physical Therapy

## 2021-09-18 DIAGNOSIS — M6281 Muscle weakness (generalized): Secondary | ICD-10-CM | POA: Diagnosis not present

## 2021-09-18 DIAGNOSIS — R262 Difficulty in walking, not elsewhere classified: Secondary | ICD-10-CM | POA: Diagnosis not present

## 2021-09-18 DIAGNOSIS — M545 Low back pain, unspecified: Secondary | ICD-10-CM | POA: Insufficient documentation

## 2021-09-24 ENCOUNTER — Ambulatory Visit (HOSPITAL_BASED_OUTPATIENT_CLINIC_OR_DEPARTMENT_OTHER): Payer: Medicare Other | Admitting: Physical Therapy

## 2021-09-24 ENCOUNTER — Other Ambulatory Visit: Payer: Self-pay

## 2021-09-24 ENCOUNTER — Encounter (HOSPITAL_BASED_OUTPATIENT_CLINIC_OR_DEPARTMENT_OTHER): Payer: Self-pay | Admitting: Physical Therapy

## 2021-09-24 DIAGNOSIS — M545 Low back pain, unspecified: Secondary | ICD-10-CM | POA: Diagnosis not present

## 2021-09-24 DIAGNOSIS — M6281 Muscle weakness (generalized): Secondary | ICD-10-CM

## 2021-09-24 DIAGNOSIS — R262 Difficulty in walking, not elsewhere classified: Secondary | ICD-10-CM

## 2021-09-24 NOTE — Therapy (Signed)
PHYSICAL THERAPY TREATMENT NOTE   Patient Name: Elaine Harper MRN: [id number] DOB:01/07/1956, 65 y.o., female Today's Date: 09/24/2021   PT End of Session - 09/24/21 1020     Visit Number 2    Number of Visits 16    Date for PT Re-Evaluation 12/17/21    Authorization Type [employer] Medicare    PT Start Time 1020    PT Stop Time 1100    PT Time Calculation (min) 40 min    Activity Tolerance Patient tolerated treatment well    Behavior During Therapy WFL for tasks assessed/performed              Past Medical History:  Diagnosis Date   Anxiety    Complication of anesthesia    hard to put to sleep   Hypertension    Menopause    Past Surgical History:  Procedure Laterality Date   ABDOMINAL HYSTERECTOMY     ABLATION ON ENDOMETRIOSIS     LAPAROSCOPIC APPENDECTOMY     MELANOMA EXCISION N/A 09/16/2018   Procedure: WIDE LOCAL EXCISION WITH ADVANCEMENT FLAP CLOSURE;  Surgeon: Stark Klein, MD;  Location: [place];  Service: General;  Laterality: N/A;   SENTINEL NODE BIOPSY Right 09/16/2018   Procedure: SENTINEL NODE BIOPSY OF RIGHT LOWER LEG MELANOMA;  Surgeon: Stark Klein, MD;  Location: [place];  Service: General;  Laterality: Right;   There are no problems to display for this patient.   PCP: Lajean Manes, MD  REFERRING PROVIDER: Lajean Manes, MD  REFERRING DIAG: [id number] (ICD-10-CM) - Lumbago with sciatica, left side   THERAPY DIAG:  Pain, lumbar region  Muscle weakness (generalized)  Difficulty walking  ONSET DATE: July 2022   SUBJECTIVE:                                                                                                                                                                                           SUBJECTIVE STATEMENT: Pt states the back pain is about the same. She has not had an opportunity to do the HEP consistently due to the travel and holiday plans.   PERTINENT HISTORY:  Discectomy and  laminectomy on L5-S1 in May 2022  PAIN:  Are you having pain? No VAS scale: 0/10, Worst 10/10 Pain location: L low back Pain orientation: Right and Left  PAIN TYPE: aching Pain description: intermittent  Aggravating factors: walking, standing too long;  Relieving factors: resting, ibuprofen, ice, sitting  PRECAUTIONS: None  WEIGHT BEARING RESTRICTIONS No  FALLS:  Has patient fallen in last 6 months? No,   LIVING ENVIRONMENT: Lives with: lives with their family Lives  in: House/apartment Stairs: Yes; 3 floors Has following equipment at home: None  OCCUPATION: retired  PLOF: Independent  PATIENT GOALS : Pt walks 3-4 miles a day for exercise. Pt would like to exercise without pain.    OBJECTIVE:   DIAGNOSTIC FINDINGS:  L5-S1:Postsurgical changes from right laminectomy and micro discectomy with interval resolution of the right subarticular extruded disc component. T1 hypointense, enhancing tissue in is now seen in right central/subarticular zone, consistent with postsurgical granulation tissue.No spinal canal or neural foraminal stenosis. Disc uncovering and prominent hypertrophic facet degenerative changes with and prominent left-sided ligamentum flavum redundancy resulting in mild left subarticular zone stenosis and moderate left neural foraminal narrowing, progressed since prior MRI.   IMPRESSION: 1. Postsurgical changes at L5-S1 with interval resolution of right subarticular disc extrusion, noting enhancement in this location, likely representing postsurgical granulation tissue. 2. Moderate left neural foraminal narrowing at L5-S1, progressed from prior MRI, potentially impinging on the exiting left L5 nerve root.  PATIENT SURVEYS:  FOTO 56 69 at D/C 3pts MCII  TODAY'S TREATMENT   CPA grade III L1-5 (recreation of L LE NT) Bilat STM Lumbar paraspinals  Exercises Supine Posterior Pelvic Tilt - 2s hold 2x10 Supine Lower Trunk Rotation - 3s hold  20x Supine Bridge 2x10 GTB heels up Angry cat 3s 15x Standing hip ABD GTB at knees 15x each    PATIENT EDUCATION:  Education details: anatomy, exercise progression, DOMS expectations, muscle firing,  envelope of function, HEP, POC  Person educated: Patient Education method: Explanation, Demonstration, Tactile cues, Verbal cues, and Handouts Education comprehension: verbalized understanding, returned demonstration, verbal cues required, and tactile cues required   HOME EXERCISE PROGRAM:  Access Code: [email or web address] URL: [email or web address] Date: 09/18/2021 Prepared by: Daleen Bo  ASSESSMENT:  CLINICAL IMPRESSION: Pt able to tolerate session today with minimal pain. Manual therapy recreated LE NT suggesting pt is more suited to movement based intervention. Pt does have a closing pattern restriction and prefers flexion based movements to reduce radicular symptoms. Pt with more HS dominance and cramping at today's session. Plan to introduce more upright strengthening and lifting mechanics at next session.  Objective impairments include Abnormal gait, decreased activity tolerance, decreased endurance, difficulty walking, decreased ROM, decreased strength, hypomobility, increased fascial restrictions, increased muscle spasms, impaired flexibility, impaired sensation, improper body mechanics, postural dysfunction, and pain. These impairments are limiting patient from cleaning, community activity, yard work, shopping, and exercise . Personal factors including Age, Fitness, Past/current experiences, and Time since onset of injury/illness/exacerbation are also affecting patient's functional outcome. Patient will benefit from skilled PT to address above impairments and improve overall function.  REHAB POTENTIAL: Good  CLINICAL DECISION MAKING: Stable/uncomplicated  EVALUATION COMPLEXITY: Low   GOALS:   SHORT TERM GOALS:  STG Name Target Date Goal status  1 Pt will become  independent with HEP in order to demonstrate synthesis of PT education.   10/08/2021 INITIAL  2 Pt will report at least 2 pt reduction on NPRS scale for pain in order to demonstrate functional improvement with household activity, self care, and ADL.   11/05/2021 INITIAL  3 Pt will score at least 3 pt increase on FOTO to demonstrate functional improvement in MCII and pt perceived function.    11/05/2021 INITIAL   LONG TERM GOALS:   LTG Name Target Date Goal status  1 Pt  will become independent with final HEP in order to demonstrate synthesis of PT education.  12/17/2021 INITIAL  2 Pt will be able to demonstrate/report ability to walk >35  mins or 3 miles without pain in order to demonstrate functional improvement and tolerance to exercise and community mobility.   12/17/2021 INITIAL  3 Pt will score >/= 69 on FOTO to demonstrate functional improvement in LBP.   12/17/2021 INITIAL  4 Pt will be able to perform 5XSTS in under 12s  in order to demonstrate functional improvement above the cut off score for adults.   12/17/2021 INITIAL   PLAN: PT FREQUENCY: 1-2x/week  PT DURATION: 12 weeks (likely D/C by 8)  PLANNED INTERVENTIONS: Therapeutic exercises, Therapeutic activity, Neuro Muscular re-education, Balance training, Gait training, Patient/Family education, Joint mobilization, Stair training, Orthotic/Fit training, Aquatic Therapy, Dry Needling, Electrical stimulation, Spinal mobilization, Cryotherapy, Moist heat, scar mobilization, Taping, Vasopneumatic device, Traction, Ultrasound, and Ionotophoresis 4mg /ml Dexamethasone  PLAN FOR NEXT SESSION: review HEP, llumbar flexion stretching, farmer's carry, offset march with KB  Daleen Bo PT, DPT 09/24/21 11:01 AM

## 2021-10-07 ENCOUNTER — Encounter (HOSPITAL_BASED_OUTPATIENT_CLINIC_OR_DEPARTMENT_OTHER): Payer: Self-pay | Admitting: Physical Therapy

## 2021-10-07 ENCOUNTER — Other Ambulatory Visit: Payer: Self-pay

## 2021-10-07 ENCOUNTER — Ambulatory Visit (HOSPITAL_BASED_OUTPATIENT_CLINIC_OR_DEPARTMENT_OTHER): Payer: Medicare Other | Attending: Neurosurgery | Admitting: Physical Therapy

## 2021-10-07 DIAGNOSIS — M545 Low back pain, unspecified: Secondary | ICD-10-CM | POA: Insufficient documentation

## 2021-10-07 DIAGNOSIS — R262 Difficulty in walking, not elsewhere classified: Secondary | ICD-10-CM | POA: Diagnosis not present

## 2021-10-07 DIAGNOSIS — M6281 Muscle weakness (generalized): Secondary | ICD-10-CM | POA: Insufficient documentation

## 2021-10-07 NOTE — Therapy (Signed)
PHYSICAL THERAPY TREATMENT NOTE   Patient Name: Elaine Harper MRN: [id number] DOB:1956/04/25, 65 y.o., female Today's Date: 10/07/2021   PT End of Session - 10/07/21 1301     Visit Number 3    Number of Visits 16    Date for PT Re-Evaluation 12/17/21    Authorization Type [employer] Medicare    PT Start Time 1300    PT Stop Time 1340    PT Time Calculation (min) 40 min    Activity Tolerance Patient tolerated treatment well    Behavior During Therapy WFL for tasks assessed/performed               Past Medical History:  Diagnosis Date   Anxiety    Complication of anesthesia    hard to put to sleep   Hypertension    Menopause    Past Surgical History:  Procedure Laterality Date   ABDOMINAL HYSTERECTOMY     ABLATION ON ENDOMETRIOSIS     LAPAROSCOPIC APPENDECTOMY     MELANOMA EXCISION N/A 09/16/2018   Procedure: WIDE LOCAL EXCISION WITH ADVANCEMENT FLAP CLOSURE;  Surgeon: Stark Klein, MD;  Location: [facility];  Service: General;  Laterality: N/A;   SENTINEL NODE BIOPSY Right 09/16/2018   Procedure: SENTINEL NODE BIOPSY OF RIGHT LOWER LEG MELANOMA;  Surgeon: Stark Klein, MD;  Location: [place] [facility];  Service: General;  Laterality: Right;   There are no problems to display for this patient.   PCP: Lajean Manes, MD  REFERRING PROVIDER: Lajean Manes, MD  REFERRING DIAG: [id number] (ICD-10-CM) - Lumbago with sciatica, left side   THERAPY DIAG:  Pain, lumbar region  Muscle weakness (generalized)  Difficulty walking  ONSET DATE: July 2022   SUBJECTIVE:                                                                                                                                                                                           SUBJECTIVE STATEMENT: Pt states the pain is still the same. She has been doing the HEP. She states she had pain with walking 3 miles.  Pt able to walk 1.5 miles before start of pain.   PERTINENT  HISTORY:  Discectomy and laminectomy on L5-S1 in May 2022  PAIN:  Are you having pain? No VAS scale: 0/10, Worst 10/10 Pain location: L low back Pain orientation: Right and Left  PAIN TYPE: aching Pain description: intermittent  Aggravating factors: walking, standing too long;  Relieving factors: resting, ibuprofen, ice, sitting  PRECAUTIONS: None  WEIGHT BEARING RESTRICTIONS No  FALLS:  Has patient fallen in last 6 months? No,   LIVING  ENVIRONMENT: Lives with: lives with their family Lives in: House/apartment Stairs: Yes; 3 floors Has following equipment at home: None  OCCUPATION: retired  PLOF: Independent  PATIENT GOALS : Pt walks 3-4 miles a day for exercise. Pt would like to exercise without pain.    OBJECTIVE:   DIAGNOSTIC FINDINGS:  L5-S1:Postsurgical changes from right laminectomy and micro discectomy with interval resolution of the right subarticular extruded disc component. T1 hypointense, enhancing tissue in is now seen in right central/subarticular zone, consistent with postsurgical granulation tissue.No spinal canal or neural foraminal stenosis. Disc uncovering and prominent hypertrophic facet degenerative changes with and prominent left-sided ligamentum flavum redundancy resulting in mild left subarticular zone stenosis and moderate left neural foraminal narrowing, progressed since prior MRI.   IMPRESSION: 1. Postsurgical changes at L5-S1 with interval resolution of right subarticular disc extrusion, noting enhancement in this location, likely representing postsurgical granulation tissue. 2. Moderate left neural foraminal narrowing at L5-S1, progressed from prior MRI, potentially impinging on the exiting left L5 nerve root.  PATIENT SURVEYS:  FOTO 56 69 at D/C 3pts MCII  TODAY'S TREATMENT   Exercises  Supine Posterior Pelvic Tilt - 2s hold 2x10 Supine Lower Trunk Rotation - 3s hold 20x Segmental flexion 2x10 Child's pose 2s 10x Angry  cat 3s 15x Fig 4 30s 3x Farmer's walk/offset carry 10lbs 78ftx4 (pain on last lap)    PATIENT EDUCATION:  Education details: anatomy, exercise progression, DOMS expectations, muscle firing,  envelope of function, HEP, POC  Person educated: Patient Education method: Explanation, Demonstration, Tactile cues, Verbal cues, and Handouts Education comprehension: verbalized understanding, returned demonstration, verbal cues required, and tactile cues required   HOME EXERCISE PROGRAM:  Access Code: [email or web address] URL: [email or web address] Date: 09/18/2021 Prepared by: Daleen Bo  ASSESSMENT:  CLINICAL IMPRESSION: Pt with report of increase pain since last visit. Scheduling has limited pt frequency of treatment sessions. However, pt's L sided pain coincides with attempt to return to previous level of activity at a reduce capacity of strength/endurance. Pt still responds better to flexion based movements so segmental flexion added into HEP and standing hip ABD removed. Pt also given counseling on staying below pain threshold throughout the day while still maintain same volume of physical activity. Plan to revisit lifting/strengthening exercise at next session if pain improves.   Objective impairments include Abnormal gait, decreased activity tolerance, decreased endurance, difficulty walking, decreased ROM, decreased strength, hypomobility, increased fascial restrictions, increased muscle spasms, impaired flexibility, impaired sensation, improper body mechanics, postural dysfunction, and pain. These impairments are limiting patient from cleaning, community activity, yard work, shopping, and exercise . Personal factors including Age, Fitness, Past/current experiences, and Time since onset of injury/illness/exacerbation are also affecting patient's functional outcome. Patient will benefit from skilled PT to address above impairments and improve overall function.  REHAB POTENTIAL:  Good  CLINICAL DECISION MAKING: Stable/uncomplicated  EVALUATION COMPLEXITY: Low   GOALS:   SHORT TERM GOALS:  STG Name Target Date Goal status  1 Pt will become independent with HEP in order to demonstrate synthesis of PT education.   10/21/2021 INITIAL  2 Pt will report at least 2 pt reduction on NPRS scale for pain in order to demonstrate functional improvement with household activity, self care, and ADL.   11/18/2021 INITIAL  3 Pt will score at least 3 pt increase on FOTO to demonstrate functional improvement in MCII and pt perceived function.    11/18/2021 INITIAL   LONG TERM GOALS:   LTG Name Target Date Goal status  1 Pt  will become independent with final HEP in order to demonstrate synthesis of PT education.  12/30/2021 INITIAL  2 Pt will be able to demonstrate/report ability to walk >35 mins or 3 miles without pain in order to demonstrate functional improvement and tolerance to exercise and community mobility.   12/30/2021 INITIAL  3 Pt will score >/= 69 on FOTO to demonstrate functional improvement in LBP.   12/30/2021 INITIAL  4 Pt will be able to perform 5XSTS in under 12s  in order to demonstrate functional improvement above the cut off score for adults.   12/30/2021 INITIAL   PLAN: PT FREQUENCY: 1-2x/week  PT DURATION: 12 weeks (likely D/C by 8)  PLANNED INTERVENTIONS: Therapeutic exercises, Therapeutic activity, Neuro Muscular re-education, Balance training, Gait training, Patient/Family education, Joint mobilization, Stair training, Orthotic/Fit training, Aquatic Therapy, Dry Needling, Electrical stimulation, Spinal mobilization, Cryotherapy, Moist heat, scar mobilization, Taping, Vasopneumatic device, Traction, Ultrasound, and Ionotophoresis 4mg /ml Dexamethasone  PLAN FOR NEXT SESSION: review HEP, llumbar flexion stretching, farmer's carry, offset march with KB  Daleen Bo PT, DPT 10/07/21 1:48 PM

## 2021-10-10 ENCOUNTER — Encounter (HOSPITAL_BASED_OUTPATIENT_CLINIC_OR_DEPARTMENT_OTHER): Payer: Self-pay | Admitting: Physical Therapy

## 2021-10-14 ENCOUNTER — Other Ambulatory Visit: Payer: Self-pay

## 2021-10-14 ENCOUNTER — Ambulatory Visit (HOSPITAL_BASED_OUTPATIENT_CLINIC_OR_DEPARTMENT_OTHER): Payer: Medicare Other | Admitting: Physical Therapy

## 2021-10-14 ENCOUNTER — Encounter (HOSPITAL_BASED_OUTPATIENT_CLINIC_OR_DEPARTMENT_OTHER): Payer: Self-pay | Admitting: Physical Therapy

## 2021-10-14 DIAGNOSIS — M545 Low back pain, unspecified: Secondary | ICD-10-CM | POA: Diagnosis not present

## 2021-10-14 DIAGNOSIS — R262 Difficulty in walking, not elsewhere classified: Secondary | ICD-10-CM | POA: Diagnosis not present

## 2021-10-14 DIAGNOSIS — M6281 Muscle weakness (generalized): Secondary | ICD-10-CM | POA: Diagnosis not present

## 2021-10-14 NOTE — Therapy (Signed)
PHYSICAL THERAPY TREATMENT NOTE   Patient Name: Elaine Harper MRN: [id number] DOB:1956/03/31, 65 y.o., female Today's Date: 10/14/2021   PT End of Session - 10/14/21 1256     Visit Number 4    Number of Visits 16    Date for PT Re-Evaluation 12/17/21    Authorization Type [employer] Medicare    PT Start Time 1300    PT Stop Time 1340    PT Time Calculation (min) 40 min    Activity Tolerance Patient tolerated treatment well    Behavior During Therapy WFL for tasks assessed/performed                Past Medical History:  Diagnosis Date   Anxiety    Complication of anesthesia    hard to put to sleep   Hypertension    Menopause    Past Surgical History:  Procedure Laterality Date   ABDOMINAL HYSTERECTOMY     ABLATION ON ENDOMETRIOSIS     LAPAROSCOPIC APPENDECTOMY     MELANOMA EXCISION N/A 09/16/2018   Procedure: WIDE LOCAL EXCISION WITH ADVANCEMENT FLAP CLOSURE;  Surgeon: Stark Klein, MD;  Location: [place];  Service: General;  Laterality: N/A;   SENTINEL NODE BIOPSY Right 09/16/2018   Procedure: SENTINEL NODE BIOPSY OF RIGHT LOWER LEG MELANOMA;  Surgeon: Stark Klein, MD;  Location: [place];  Service: General;  Laterality: Right;   There are no problems to display for this patient.    PCP: Lajean Manes, MD  REFERRING PROVIDER: Lajean Manes, MD  REFERRING DIAG: [phone] (ICD-10-CM) - Lumbago with sciatica, left side   THERAPY DIAG:  Pain, lumbar region  Muscle weakness (generalized)  Difficulty walking  ONSET DATE: July 2022   SUBJECTIVE:                                                                                                                                                                                           SUBJECTIVE STATEMENT: Pt states the pain was not any worse since last session. She states she did not have normal pain from last time after standing for long periods. Walking for long periods  still bothers her.   PERTINENT HISTORY:  Discectomy and laminectomy on L5-S1 in May 2022  PAIN:  Are you having pain? No VAS scale: 0/10, Worst 10/10 Pain location: L low back Pain orientation: Right and Left  PAIN TYPE: aching Pain description: intermittent  Aggravating factors: walking, standing too long;  Relieving factors: resting, ibuprofen, ice, sitting  PRECAUTIONS: None  WEIGHT BEARING RESTRICTIONS No  FALLS:  Has patient fallen in last 6 months? No,  LIVING ENVIRONMENT: Lives with: lives with their family Lives in: House/apartment Stairs: Yes; 3 floors Has following equipment at home: None  OCCUPATION: retired  PLOF: Independent  PATIENT GOALS : Pt walks 3-4 miles a day for exercise. Pt would like to exercise without pain.    OBJECTIVE:   DIAGNOSTIC FINDINGS:  L5-S1:Postsurgical changes from right laminectomy and micro discectomy with interval resolution of the right subarticular extruded disc component. T1 hypointense, enhancing tissue in is now seen in right central/subarticular zone, consistent with postsurgical granulation tissue.No spinal canal or neural foraminal stenosis. Disc uncovering and prominent hypertrophic facet degenerative changes with and prominent left-sided ligamentum flavum redundancy resulting in mild left subarticular zone stenosis and moderate left neural foraminal narrowing, progressed since prior MRI.   IMPRESSION: 1. Postsurgical changes at L5-S1 with interval resolution of right subarticular disc extrusion, noting enhancement in this location, likely representing postsurgical granulation tissue. 2. Moderate left neural foraminal narrowing at L5-S1, progressed from prior MRI, potentially impinging on the exiting left L5 nerve root.  PATIENT SURVEYS:  FOTO 56 69 at D/C 3pts MCII  TODAY'S TREATMENT   Jefferson curl (standing segmental flexion) 3x5 5lbs Clamshell GTB at knees 10x 3s hold each Sidestepping GTB at knees  83ft x2 Child's pose 2s 10x Angry cat 3s 15x Fig 4 30s 3x Farmer's walk/offset carry 10lbs 30ftx3 Lifting mechanics 10lb KB (simulating groceries from car) cued for ab brace, neutral spine, and hip hinging   PATIENT EDUCATION:  Education details: lifting mechanics, anatomy, exercise progression, DOMS expectations, muscle firing,  envelope of function, HEP, POC  Person educated: Patient Education method: Explanation, Demonstration, Tactile cues, Verbal cues, and Handouts Education comprehension: verbalized understanding, returned demonstration, verbal cues required, and tactile cues required   HOME EXERCISE PROGRAM:  Access Code: [email or web address] URL: [email or web address] Date: 10/14/2021 Prepared by: Daleen Bo  Exercises Supine Posterior Pelvic Tilt - 2 x daily - 7 x weekly - 2 sets - 10 reps - 2 hold Supine Bridge - 2 x daily - 7 x weekly - 3 sets - 10 reps Supine Figure 4 Piriformis Stretch - 2 x daily - 7 x weekly - 1 sets - 3 reps - 30 hold Standing Forward Trunk Flexion - 2 x daily - 7 x weekly - 2 sets - 5 reps Side Stepping with Resistance at Thighs - 1 x daily - 7 x weekly - 1 sets - 2 reps - 12ft hold   ASSESSMENT:  CLINICAL IMPRESSION: Pt with good tolerance to progression of loaded exercise today, especially given report of improvement in pain from previous session. Pt able to perform both loaded segmental flexion as well as traditional braced lifting at today's session with good success after VC and TC. Pt HEP updated to include CKC hip ABD strengthening in order to help with walking and ADL endurance. Plan to continue with improving functional capacity as well as building self home strengthening HEP. Trial step up and RDL at next session.  Objective impairments include Abnormal gait, decreased activity tolerance, decreased endurance, difficulty walking, decreased ROM, decreased strength, hypomobility, increased fascial restrictions, increased muscle spasms,  impaired flexibility, impaired sensation, improper body mechanics, postural dysfunction, and pain. These impairments are limiting patient from cleaning, community activity, yard work, shopping, and exercise . Personal factors including Age, Fitness, Past/current experiences, and Time since onset of injury/illness/exacerbation are also affecting patient's functional outcome. Patient will benefit from skilled PT to address above impairments and improve overall function.  REHAB POTENTIAL: Good  CLINICAL DECISION MAKING: Stable/uncomplicated  EVALUATION COMPLEXITY: Low   GOALS:   SHORT TERM GOALS:  STG Name Target Date Goal status  1 Pt will become independent with HEP in order to demonstrate synthesis of PT education.   10/28/2021 INITIAL  2 Pt will report at least 2 pt reduction on NPRS scale for pain in order to demonstrate functional improvement with household activity, self care, and ADL.   11/25/2021 INITIAL  3 Pt will score at least 3 pt increase on FOTO to demonstrate functional improvement in MCII and pt perceived function.    11/25/2021 INITIAL   LONG TERM GOALS:   LTG Name Target Date Goal status  1 Pt  will become independent with final HEP in order to demonstrate synthesis of PT education.  01/06/2022 INITIAL  2 Pt will be able to demonstrate/report ability to walk >35 mins or 3 miles without pain in order to demonstrate functional improvement and tolerance to exercise and community mobility.   01/06/2022 INITIAL  3 Pt will score >/= 69 on FOTO to demonstrate functional improvement in LBP.   01/06/2022 INITIAL  4 Pt will be able to perform 5XSTS in under 12s  in order to demonstrate functional improvement above the cut off score for adults.   01/06/2022 INITIAL   PLAN: PT FREQUENCY: 1-2x/week  PT DURATION: 12 weeks (likely D/C by 8)  PLANNED INTERVENTIONS: Therapeutic exercises, Therapeutic activity, Neuro Muscular re-education, Balance training, Gait training,  Patient/Family education, Joint mobilization, Stair training, Orthotic/Fit training, Aquatic Therapy, Dry Needling, Electrical stimulation, Spinal mobilization, Cryotherapy, Moist heat, scar mobilization, Taping, Vasopneumatic device, Traction, Ultrasound, and Ionotophoresis 4mg /ml Dexamethasone  PLAN FOR NEXT SESSION: review HEP, offset march with KB, RDL, step up  Daleen Bo PT, DPT 10/14/21 1:56 PM

## 2021-10-23 ENCOUNTER — Other Ambulatory Visit: Payer: Self-pay

## 2021-10-23 ENCOUNTER — Encounter (HOSPITAL_BASED_OUTPATIENT_CLINIC_OR_DEPARTMENT_OTHER): Payer: Self-pay | Admitting: Physical Therapy

## 2021-10-23 ENCOUNTER — Ambulatory Visit (HOSPITAL_BASED_OUTPATIENT_CLINIC_OR_DEPARTMENT_OTHER): Payer: Medicare Other | Admitting: Physical Therapy

## 2021-10-23 DIAGNOSIS — M6281 Muscle weakness (generalized): Secondary | ICD-10-CM

## 2021-10-23 DIAGNOSIS — R262 Difficulty in walking, not elsewhere classified: Secondary | ICD-10-CM | POA: Diagnosis not present

## 2021-10-23 DIAGNOSIS — M545 Low back pain, unspecified: Secondary | ICD-10-CM | POA: Diagnosis not present

## 2021-10-23 NOTE — Therapy (Signed)
PHYSICAL THERAPY TREATMENT NOTE   Patient Name: Elaine Harper MRN: [id number] DOB:1956/09/24, 65 y.o., female Today's Date: 10/23/2021   PT End of Session - 10/23/21 1607     Visit Number 5    Number of Visits 16    Date for PT Re-Evaluation 12/17/21    Authorization Type [employer] Medicare    PT Start Time 1600    PT Stop Time 1640    PT Time Calculation (min) 40 min    Activity Tolerance Patient tolerated treatment well    Behavior During Therapy WFL for tasks assessed/performed                 Past Medical History:  Diagnosis Date   Anxiety    Complication of anesthesia    hard to put to sleep   Hypertension    Menopause    Past Surgical History:  Procedure Laterality Date   ABDOMINAL HYSTERECTOMY     ABLATION ON ENDOMETRIOSIS     LAPAROSCOPIC APPENDECTOMY     MELANOMA EXCISION N/A 09/16/2018   Procedure: WIDE LOCAL EXCISION WITH ADVANCEMENT FLAP CLOSURE;  Surgeon: Stark Klein, MD;  Location: [facility];  Service: General;  Laterality: N/A;   SENTINEL NODE BIOPSY Right 09/16/2018   Procedure: SENTINEL NODE BIOPSY OF RIGHT LOWER LEG MELANOMA;  Surgeon: Stark Klein, MD;  Location: [facility];  Service: General;  Laterality: Right;   There are no problems to display for this patient.    PCP: Lajean Manes, MD  REFERRING PROVIDER: Lajean Manes, MD  REFERRING DIAG: [phone] (ICD-10-CM) - Lumbago with sciatica, left side   THERAPY DIAG:  Pain, lumbar region  Muscle weakness (generalized)  Difficulty walking  ONSET DATE: July 2022   SUBJECTIVE:                                                                                                                                                                                           SUBJECTIVE STATEMENT: Pt states the pain in the hip has not changed at this time. The pain is still there. She states she has L sided hip pain waking up today.   PERTINENT HISTORY:   Discectomy and laminectomy on L5-S1 in May 2022  PAIN:  Are you having pain? No VAS scale: 0/10, Worst 10/10 Pain location: L low back Pain orientation: Right and Left  PAIN TYPE: aching Pain description: intermittent  Aggravating factors: walking, standing too long;  Relieving factors: resting, ibuprofen, ice, sitting  PRECAUTIONS: None  WEIGHT BEARING RESTRICTIONS No  FALLS:  Has patient fallen in last 6 months? No,   LIVING ENVIRONMENT: Lives  with: lives with their family Lives in: House/apartment Stairs: Yes; 3 floors Has following equipment at home: None  OCCUPATION: retired  PLOF: Independent  PATIENT GOALS : Pt walks 3-4 miles a day for exercise. Pt would like to exercise without pain.    OBJECTIVE:   DIAGNOSTIC FINDINGS:  L5-S1:Postsurgical changes from right laminectomy and micro discectomy with interval resolution of the right subarticular extruded disc component. T1 hypointense, enhancing tissue in is now seen in right central/subarticular zone, consistent with postsurgical granulation tissue.No spinal canal or neural foraminal stenosis. Disc uncovering and prominent hypertrophic facet degenerative changes with and prominent left-sided ligamentum flavum redundancy resulting in mild left subarticular zone stenosis and moderate left neural foraminal narrowing, progressed since prior MRI.   IMPRESSION: 1. Postsurgical changes at L5-S1 with interval resolution of right subarticular disc extrusion, noting enhancement in this location, likely representing postsurgical granulation tissue. 2. Moderate left neural foraminal narrowing at L5-S1, progressed from prior MRI, potentially impinging on the exiting left L5 nerve root.  PATIENT SURVEYS:  FOTO 56 69 at D/C 3pts MCII  TODAY'S TREATMENT   Nustep L5 5 min (stopped at 5 min due to L fatiguing pain)   Prone quad stretch 30s 3x on L   Seated LAQ 5lbs 3x10 Leg press shuttle 100lbs 3x10 Sidestepping  GTB at knees 2ft x2 Single knee extension L 15lbs 3x10 Standing HS curl 5lbs 3x10   PATIENT EDUCATION:  Education details: lifting mechanics, anatomy, exercise progression, DOMS expectations, muscle firing,  envelope of function, HEP, POC  Person educated: Patient Education method: Explanation, Demonstration, Tactile cues, Verbal cues, and Handouts Education comprehension: verbalized understanding, returned demonstration, verbal cues required, and tactile cues required   HOME EXERCISE PROGRAM:  Access Code: [email or web address] URL: [email or web address] Date: 10/23/2021 Prepared by: Daleen Bo  Exercises Supine Bridge - 2 x daily - 7 x weekly - 3 sets - 10 reps Supine Figure 4 Piriformis Stretch - 2 x daily - 7 x weekly - 1 sets - 3 reps - 30 hold Standing Forward Trunk Flexion - 2 x daily - 7 x weekly - 2 sets - 5 reps Side Stepping with Resistance at Thighs - 1 x daily - 3-4 x weekly - 1 sets - 2 reps - 67ft hold Full Leg Press - 1 x daily - 3-4 x weekly - 3 sets - 10 reps Single Leg Knee Extension with Weight Machine - 1 x daily - 3-4 x weekly - 3 sets - 10 reps    ASSESSMENT:  CLINICAL IMPRESSION: Pt with good tolerance to progression of loaded exercise today. Upon observation, pt had decreased quad and HS girth on L LE that could potentially coincide with L sided strength deficits and community mobility limitations. With testing, pt at <80% quad index on L as well as <80% HS strength on L. HEP changed to promote more L LE strength in addition to previously added lumbopelvic strengthening. Plan to assess effects of L LE strength program to potentially decrease frequency. Pt advised to not aggravate pain with walking to avoid carryover pain and soreness. Pt without pain during session with strengthening but did have increased thigh pain with endurance activity on Nustep.  Objective impairments include Abnormal gait, decreased activity tolerance, decreased endurance,  difficulty walking, decreased ROM, decreased strength, hypomobility, increased fascial restrictions, increased muscle spasms, impaired flexibility, impaired sensation, improper body mechanics, postural dysfunction, and pain. These impairments are limiting patient from cleaning, community activity, yard work, shopping, and exercise . Personal factors including Age, Fitness, Past/current  experiences, and Time since onset of injury/illness/exacerbation are also affecting patient's functional outcome. Patient will benefit from skilled PT to address above impairments and improve overall function.  REHAB POTENTIAL: Good  CLINICAL DECISION MAKING: Stable/uncomplicated  EVALUATION COMPLEXITY: Low   GOALS:   SHORT TERM GOALS:  STG Name Target Date Goal status  1 Pt will become independent with HEP in order to demonstrate synthesis of PT education.   11/06/2021 INITIAL  2 Pt will report at least 2 pt reduction on NPRS scale for pain in order to demonstrate functional improvement with household activity, self care, and ADL.   12/04/2021 INITIAL  3 Pt will score at least 3 pt increase on FOTO to demonstrate functional improvement in MCII and pt perceived function.    12/04/2021 INITIAL   LONG TERM GOALS:   LTG Name Target Date Goal status  1 Pt  will become independent with final HEP in order to demonstrate synthesis of PT education.  01/15/2022 INITIAL  2 Pt will be able to demonstrate/report ability to walk >35 mins or 3 miles without pain in order to demonstrate functional improvement and tolerance to exercise and community mobility.   01/15/2022 INITIAL  3 Pt will score >/= 69 on FOTO to demonstrate functional improvement in LBP.   01/15/2022 INITIAL  4 Pt will be able to perform 5XSTS in under 12s  in order to demonstrate functional improvement above the cut off score for adults.   01/15/2022 INITIAL   PLAN: PT FREQUENCY: 1-2x/week  PT DURATION: 12 weeks (likely D/C by 8)  PLANNED  INTERVENTIONS: Therapeutic exercises, Therapeutic activity, Neuro Muscular re-education, Balance training, Gait training, Patient/Family education, Joint mobilization, Stair training, Orthotic/Fit training, Aquatic Therapy, Dry Needling, Electrical stimulation, Spinal mobilization, Cryotherapy, Moist heat, scar mobilization, Taping, Vasopneumatic device, Traction, Ultrasound, and Ionotophoresis 4mg /ml Dexamethasone  PLAN FOR NEXT SESSION: review HEP, offset march with KB, RDL, step up  Daleen Bo PT, DPT 10/23/21 4:53 PM

## 2021-10-24 ENCOUNTER — Encounter (HOSPITAL_BASED_OUTPATIENT_CLINIC_OR_DEPARTMENT_OTHER): Payer: Self-pay | Admitting: Physical Therapy

## 2021-10-25 ENCOUNTER — Encounter (HOSPITAL_BASED_OUTPATIENT_CLINIC_OR_DEPARTMENT_OTHER): Payer: Self-pay | Admitting: Physical Therapy

## 2021-10-30 ENCOUNTER — Other Ambulatory Visit: Payer: Self-pay

## 2021-10-30 ENCOUNTER — Ambulatory Visit (HOSPITAL_BASED_OUTPATIENT_CLINIC_OR_DEPARTMENT_OTHER): Payer: Medicare Other | Attending: Neurosurgery | Admitting: Physical Therapy

## 2021-10-30 ENCOUNTER — Encounter (HOSPITAL_BASED_OUTPATIENT_CLINIC_OR_DEPARTMENT_OTHER): Payer: Self-pay | Admitting: Physical Therapy

## 2021-10-30 DIAGNOSIS — M6281 Muscle weakness (generalized): Secondary | ICD-10-CM | POA: Insufficient documentation

## 2021-10-30 DIAGNOSIS — R262 Difficulty in walking, not elsewhere classified: Secondary | ICD-10-CM | POA: Insufficient documentation

## 2021-10-30 DIAGNOSIS — M545 Low back pain, unspecified: Secondary | ICD-10-CM | POA: Insufficient documentation

## 2021-10-30 NOTE — Therapy (Signed)
PHYSICAL THERAPY TREATMENT NOTE   Patient Name: Elaine Harper MRN: [id number] DOB:1956/06/30, 66 y.o., female Today's Date: 10/30/2021   PT End of Session - 10/30/21 1558     Visit Number 6    Number of Visits 16    Date for PT Re-Evaluation 12/17/21    Authorization Type [employer] Medicare    PT Start Time 1520    PT Stop Time 1600    PT Time Calculation (min) 40 min    Activity Tolerance Patient tolerated treatment well    Behavior During Therapy WFL for tasks assessed/performed                  Past Medical History:  Diagnosis Date   Anxiety    Complication of anesthesia    hard to put to sleep   Hypertension    Menopause    Past Surgical History:  Procedure Laterality Date   ABDOMINAL HYSTERECTOMY     ABLATION ON ENDOMETRIOSIS     LAPAROSCOPIC APPENDECTOMY     MELANOMA EXCISION N/A 09/16/2018   Procedure: WIDE LOCAL EXCISION WITH ADVANCEMENT FLAP CLOSURE;  Surgeon: Stark Klein, MD;  Location: [facility];  Service: General;  Laterality: N/A;   SENTINEL NODE BIOPSY Right 09/16/2018   Procedure: SENTINEL NODE BIOPSY OF RIGHT LOWER LEG MELANOMA;  Surgeon: Stark Klein, MD;  Location: [facility];  Service: General;  Laterality: Right;   There are no problems to display for this patient.    PCP: Lajean Manes, MD  REFERRING PROVIDER: Lajean Manes, MD  REFERRING DIAG: [phone] (ICD-10-CM) - Lumbago with sciatica, left side   THERAPY DIAG:  Pain, lumbar region  Muscle weakness (generalized)  Difficulty walking  ONSET DATE: July 2022   SUBJECTIVE:                                                                                                                                                                                           SUBJECTIVE STATEMENT: Pt states that the pain will still occur with walking. She has been going to the gym 2x/week and has been working on L LE strength.  PERTINENT HISTORY:  Discectomy and  laminectomy on L5-S1 in May 2022  PAIN:  Are you having pain? No VAS scale: 0/10, Worst 10/10 Pain location: L low back Pain orientation: Right and Left  PAIN TYPE: aching Pain description: intermittent  Aggravating factors: walking, standing too long;  Relieving factors: resting, ibuprofen, ice, sitting  PRECAUTIONS: None  WEIGHT BEARING RESTRICTIONS No  FALLS:  Has patient fallen in last 6 months? No,   LIVING ENVIRONMENT: Lives with: lives with  their family Lives in: House/apartment Stairs: Yes; 3 floors Has following equipment at home: None  OCCUPATION: retired  PLOF: Independent  PATIENT GOALS : Pt walks 3-4 miles a day for exercise. Pt would like to exercise without pain.    OBJECTIVE:   DIAGNOSTIC FINDINGS:  L5-S1:Postsurgical changes from right laminectomy and micro discectomy with interval resolution of the right subarticular extruded disc component. T1 hypointense, enhancing tissue in is now seen in right central/subarticular zone, consistent with postsurgical granulation tissue.No spinal canal or neural foraminal stenosis. Disc uncovering and prominent hypertrophic facet degenerative changes with and prominent left-sided ligamentum flavum redundancy resulting in mild left subarticular zone stenosis and moderate left neural foraminal narrowing, progressed since prior MRI.   IMPRESSION: 1. Postsurgical changes at L5-S1 with interval resolution of right subarticular disc extrusion, noting enhancement in this location, likely representing postsurgical granulation tissue. 2. Moderate left neural foraminal narrowing at L5-S1, progressed from prior MRI, potentially impinging on the exiting left L5 nerve root.  PATIENT SURVEYS:  FOTO 56 69 at D/C 3pts MCII  TODAY'S TREATMENT   Nustep L1 5 min    Leg press shuttle 125lbs 3x10 Step up fwd and lateral 2x10 each (Trendelenburg noted on L) STS GTB around knees 3x10 low table height Cable hip pull  through 25lbs 2x10 Fig 4 bridge 10x each    PATIENT EDUCATION:  Education details: anatomy, exercise progression, DOMS expectations, muscle firing,  envelope of function, HEP, POC  Person educated: Patient Education method: Explanation, Demonstration, Tactile cues, Verbal cues, and Handouts Education comprehension: verbalized understanding, returned demonstration, verbal cues required, and tactile cues required   HOME EXERCISE PROGRAM:  Access Code: [email or web address] URL: [email or web address] Date: 10/23/2021 Prepared by: Daleen Bo  Exercises Supine Bridge - 2 x daily - 7 x weekly - 3 sets - 10 reps Supine Figure 4 Piriformis Stretch - 2 x daily - 7 x weekly - 1 sets - 3 reps - 30 hold Standing Forward Trunk Flexion - 2 x daily - 7 x weekly - 2 sets - 5 reps Side Stepping with Resistance at Thighs - 1 x daily - 3-4 x weekly - 1 sets - 2 reps - 5ft hold Full Leg Press - 1 x daily - 3-4 x weekly - 3 sets - 10 reps Single Leg Knee Extension with Weight Machine - 1 x daily - 3-4 x weekly - 3 sets - 10 reps    ASSESSMENT:  CLINICAL IMPRESSION: Pt with good tolerance to progression of LE loaded exercise and lumbopelvic strengthening. Pt with Trendelelburg during step ups as well as lumbar extension compensation during hip extension strengthening exercise. Pt able to correct form and feel gluteal contraction vs lumbar extensors when given VC and TC. Pt largely limited by strength. Continue improve L hip extension strength to reduce lumbar compensations.   Objective impairments include Abnormal gait, decreased activity tolerance, decreased endurance, difficulty walking, decreased ROM, decreased strength, hypomobility, increased fascial restrictions, increased muscle spasms, impaired flexibility, impaired sensation, improper body mechanics, postural dysfunction, and pain. These impairments are limiting patient from cleaning, community activity, yard work, shopping, and exercise .  Personal factors including Age, Fitness, Past/current experiences, and Time since onset of injury/illness/exacerbation are also affecting patient's functional outcome. Patient will benefit from skilled PT to address above impairments and improve overall function.  REHAB POTENTIAL: Good  CLINICAL DECISION MAKING: Stable/uncomplicated  EVALUATION COMPLEXITY: Low   GOALS:   SHORT TERM GOALS:  STG Name Target Date Goal status  1 Pt will become independent  with HEP in order to demonstrate synthesis of PT education.   11/13/2021 INITIAL  2 Pt will report at least 2 pt reduction on NPRS scale for pain in order to demonstrate functional improvement with household activity, self care, and ADL.   12/11/2021 INITIAL  3 Pt will score at least 3 pt increase on FOTO to demonstrate functional improvement in MCII and pt perceived function.    12/11/2021 INITIAL   LONG TERM GOALS:   LTG Name Target Date Goal status  1 Pt  will become independent with final HEP in order to demonstrate synthesis of PT education.  01/22/2022 INITIAL  2 Pt will be able to demonstrate/report ability to walk >35 mins or 3 miles without pain in order to demonstrate functional improvement and tolerance to exercise and community mobility.   01/22/2022 INITIAL  3 Pt will score >/= 69 on FOTO to demonstrate functional improvement in LBP.   01/22/2022 INITIAL  4 Pt will be able to perform 5XSTS in under 12s  in order to demonstrate functional improvement above the cut off score for adults.   01/22/2022 INITIAL   PLAN: PT FREQUENCY: 1-2x/week  PT DURATION: 12 weeks (likely D/C by 8)  PLANNED INTERVENTIONS: Therapeutic exercises, Therapeutic activity, Neuro Muscular re-education, Balance training, Gait training, Patient/Family education, Joint mobilization, Stair training, Orthotic/Fit training, Aquatic Therapy, Dry Needling, Electrical stimulation, Spinal mobilization, Cryotherapy, Moist heat, scar mobilization, Taping,  Vasopneumatic device, Traction, Ultrasound, and Ionotophoresis 4mg /ml Dexamethasone  PLAN FOR NEXT SESSION: review fig 4 bridge, cable pull through, SL shuttle leg press   Daleen Bo PT, DPT 10/30/21 4:01 PM

## 2021-11-06 ENCOUNTER — Encounter (HOSPITAL_BASED_OUTPATIENT_CLINIC_OR_DEPARTMENT_OTHER): Payer: Self-pay | Admitting: Physical Therapy

## 2021-11-06 ENCOUNTER — Ambulatory Visit (HOSPITAL_BASED_OUTPATIENT_CLINIC_OR_DEPARTMENT_OTHER): Payer: Medicare Other | Admitting: Physical Therapy

## 2021-11-06 ENCOUNTER — Other Ambulatory Visit: Payer: Self-pay

## 2021-11-06 DIAGNOSIS — M545 Low back pain, unspecified: Secondary | ICD-10-CM | POA: Diagnosis not present

## 2021-11-06 DIAGNOSIS — R262 Difficulty in walking, not elsewhere classified: Secondary | ICD-10-CM

## 2021-11-06 DIAGNOSIS — M6281 Muscle weakness (generalized): Secondary | ICD-10-CM

## 2021-11-06 NOTE — Therapy (Signed)
PHYSICAL THERAPY TREATMENT NOTE   Patient Name: Elaine Harper MRN: [id number] DOB:May 18, 1956, 66 y.o., female Today's Date: 11/06/2021   PT End of Session - 11/06/21 1357     Visit Number 7    Number of Visits 16    Date for PT Re-Evaluation 12/17/21    Authorization Type [employer] Medicare    PT Start Time 1352   pt arrives late   PT Stop Time 1430    PT Time Calculation (min) 38 min    Activity Tolerance Patient tolerated treatment well    Behavior During Therapy WFL for tasks assessed/performed                   Past Medical History:  Diagnosis Date   Anxiety    Complication of anesthesia    hard to put to sleep   Hypertension    Menopause    Past Surgical History:  Procedure Laterality Date   ABDOMINAL HYSTERECTOMY     ABLATION ON ENDOMETRIOSIS     LAPAROSCOPIC APPENDECTOMY     MELANOMA EXCISION N/A 09/16/2018   Procedure: WIDE LOCAL EXCISION WITH ADVANCEMENT FLAP CLOSURE;  Surgeon: Stark Klein, MD;  Location: [facility];  Service: General;  Laterality: N/A;   SENTINEL NODE BIOPSY Right 09/16/2018   Procedure: SENTINEL NODE BIOPSY OF RIGHT LOWER LEG MELANOMA;  Surgeon: Stark Klein, MD;  Location: [facility];  Service: General;  Laterality: Right;   There are no problems to display for this patient.    PCP: Lajean Manes, MD  REFERRING PROVIDER: Lajean Manes, MD  REFERRING DIAG: [phone] (ICD-10-CM) - Lumbago with sciatica, left side   THERAPY DIAG:  Pain, lumbar region  Muscle weakness (generalized)  Difficulty walking  ONSET DATE: July 2022   SUBJECTIVE:                                                                                                                                                                                           SUBJECTIVE STATEMENT: Pt states that she had two pain free days after last session but has had increased back pain recently that is an aching down the legs.   PERTINENT  HISTORY:  Discectomy and laminectomy on L5-S1 in May 2022  PAIN:  Are you having pain? No VAS scale: 0/10, Worst 10/10 Pain location: L low back Pain orientation: Right and Left  PAIN TYPE: aching Pain description: intermittent  Aggravating factors: walking, standing too long;  Relieving factors: resting, ibuprofen, ice, sitting  PRECAUTIONS: None  WEIGHT BEARING RESTRICTIONS No  FALLS:  Has patient fallen in last 6 months? No,  LIVING ENVIRONMENT: Lives with: lives with their family Lives in: House/apartment Stairs: Yes; 3 floors Has following equipment at home: None  OCCUPATION: retired  PLOF: Independent  PATIENT GOALS : Pt walks 3-4 miles a day for exercise. Pt would like to exercise without pain.    OBJECTIVE:   DIAGNOSTIC FINDINGS:  L5-S1:Postsurgical changes from right laminectomy and micro discectomy with interval resolution of the right subarticular extruded disc component. T1 hypointense, enhancing tissue in is now seen in right central/subarticular zone, consistent with postsurgical granulation tissue.No spinal canal or neural foraminal stenosis. Disc uncovering and prominent hypertrophic facet degenerative changes with and prominent left-sided ligamentum flavum redundancy resulting in mild left subarticular zone stenosis and moderate left neural foraminal narrowing, progressed since prior MRI.   IMPRESSION: 1. Postsurgical changes at L5-S1 with interval resolution of right subarticular disc extrusion, noting enhancement in this location, likely representing postsurgical granulation tissue. 2. Moderate left neural foraminal narrowing at L5-S1, progressed from prior MRI, potentially impinging on the exiting left L5 nerve root.  PATIENT SURVEYS:  FOTO 56 69 at D/C 3pts MCII  TODAY'S TREATMENT   Nustep L1 8 min   Track walking 2x laps- QL/hip hike on L, decrease hip extension    Mulligan lateral and inf hip mob grade III   Fig 4 bridge  2x10 Fig 4 stretch- hip flexion motion 30s 2x on L Child's pose 5s 10x Quadruped SB warm up 10x    PATIENT EDUCATION:  Education details: anatomy, exercise progression, DOMS expectations, muscle firing,  envelope of function, HEP, POC  Person educated: Patient Education method: Explanation, Demonstration, Tactile cues, Verbal cues, and Handouts Education comprehension: verbalized understanding, returned demonstration, verbal cues required, and tactile cues required   HOME EXERCISE PROGRAM:  Access Code: [email or web address] URL: [email or web address] Date: 10/23/2021 Prepared by: Daleen Bo  Exercises Supine Bridge - 2 x daily - 7 x weekly - 3 sets - 10 reps Supine Figure 4 Piriformis Stretch - 2 x daily - 7 x weekly - 1 sets - 3 reps - 30 hold Standing Forward Trunk Flexion - 2 x daily - 7 x weekly - 2 sets - 5 reps Side Stepping with Resistance at Thighs - 1 x daily - 3-4 x weekly - 1 sets - 2 reps - 71ft hold Full Leg Press - 1 x daily - 3-4 x weekly - 3 sets - 10 reps Single Leg Knee Extension with Weight Machine - 1 x daily - 3-4 x weekly - 3 sets - 10 reps    ASSESSMENT:  CLINICAL IMPRESSION: Pt observed to have limited hip IR and extension ROM at today's session. Pt had report of "awkwardness" with walking following mobilization but had improved hip ROM. Pt advised to continue with strengthening exercise at this time for L LE. Plan to continue with hip mobilizations if good response after today's session. If no change in hip or L/S pain, plan for pt to return to MD in order for further medical management. Pt's report of NT down to toes of the L side is concerning for worsening radiculopathy.  Objective impairments include Abnormal gait, decreased activity tolerance, decreased endurance, difficulty walking, decreased ROM, decreased strength, hypomobility, increased fascial restrictions, increased muscle spasms, impaired flexibility, impaired sensation, improper body  mechanics, postural dysfunction, and pain. These impairments are limiting patient from cleaning, community activity, yard work, shopping, and exercise . Personal factors including Age, Fitness, Past/current experiences, and Time since onset of injury/illness/exacerbation are also affecting patient's functional outcome. Patient will benefit from skilled  PT to address above impairments and improve overall function.  REHAB POTENTIAL: Good  CLINICAL DECISION MAKING: Stable/uncomplicated  EVALUATION COMPLEXITY: Low   GOALS:   SHORT TERM GOALS:  STG Name Target Date Goal status  1 Pt will become independent with HEP in order to demonstrate synthesis of PT education.   11/20/2021 INITIAL  2 Pt will report at least 2 pt reduction on NPRS scale for pain in order to demonstrate functional improvement with household activity, self care, and ADL.   12/18/2021 INITIAL  3 Pt will score at least 3 pt increase on FOTO to demonstrate functional improvement in MCII and pt perceived function.    12/18/2021 INITIAL   LONG TERM GOALS:   LTG Name Target Date Goal status  1 Pt  will become independent with final HEP in order to demonstrate synthesis of PT education.  01/29/2022 INITIAL  2 Pt will be able to demonstrate/report ability to walk >35 mins or 3 miles without pain in order to demonstrate functional improvement and tolerance to exercise and community mobility.   01/29/2022 INITIAL  3 Pt will score >/= 69 on FOTO to demonstrate functional improvement in LBP.   01/29/2022 INITIAL  4 Pt will be able to perform 5XSTS in under 12s  in order to demonstrate functional improvement above the cut off score for adults.   01/29/2022 INITIAL   PLAN: PT FREQUENCY: 1-2x/week  PT DURATION: 12 weeks (likely D/C by 8)  PLANNED INTERVENTIONS: Therapeutic exercises, Therapeutic activity, Neuro Muscular re-education, Balance training, Gait training, Patient/Family education, Joint mobilization, Stair training,  Orthotic/Fit training, Aquatic Therapy, Dry Needling, Electrical stimulation, Spinal mobilization, Cryotherapy, Moist heat, scar mobilization, Taping, Vasopneumatic device, Traction, Ultrasound, and Ionotophoresis 4mg /ml Dexamethasone  PLAN FOR NEXT SESSION: review fig 4 bridge, cable pull through, SL shuttle leg press   Daleen Bo PT, DPT 11/06/21 2:36 PM

## 2021-11-14 DIAGNOSIS — L578 Other skin changes due to chronic exposure to nonionizing radiation: Secondary | ICD-10-CM | POA: Diagnosis not present

## 2021-11-14 DIAGNOSIS — Z85820 Personal history of malignant melanoma of skin: Secondary | ICD-10-CM | POA: Diagnosis not present

## 2021-11-14 DIAGNOSIS — L821 Other seborrheic keratosis: Secondary | ICD-10-CM | POA: Diagnosis not present

## 2021-11-14 DIAGNOSIS — L814 Other melanin hyperpigmentation: Secondary | ICD-10-CM | POA: Diagnosis not present

## 2021-11-14 DIAGNOSIS — Z85828 Personal history of other malignant neoplasm of skin: Secondary | ICD-10-CM | POA: Diagnosis not present

## 2021-11-14 DIAGNOSIS — L570 Actinic keratosis: Secondary | ICD-10-CM | POA: Diagnosis not present

## 2021-11-14 DIAGNOSIS — D1801 Hemangioma of skin and subcutaneous tissue: Secondary | ICD-10-CM | POA: Diagnosis not present

## 2021-11-18 ENCOUNTER — Encounter (HOSPITAL_BASED_OUTPATIENT_CLINIC_OR_DEPARTMENT_OTHER): Payer: Self-pay | Admitting: Physical Therapy

## 2021-11-18 DIAGNOSIS — I10 Essential (primary) hypertension: Secondary | ICD-10-CM | POA: Diagnosis not present

## 2021-11-18 DIAGNOSIS — Z79899 Other long term (current) drug therapy: Secondary | ICD-10-CM | POA: Diagnosis not present

## 2021-11-18 DIAGNOSIS — I471 Supraventricular tachycardia: Secondary | ICD-10-CM | POA: Diagnosis not present

## 2021-11-19 ENCOUNTER — Ambulatory Visit (HOSPITAL_BASED_OUTPATIENT_CLINIC_OR_DEPARTMENT_OTHER): Payer: Medicare Other | Admitting: Physical Therapy

## 2021-11-19 ENCOUNTER — Encounter (HOSPITAL_BASED_OUTPATIENT_CLINIC_OR_DEPARTMENT_OTHER): Payer: Self-pay | Admitting: Physical Therapy

## 2021-11-19 ENCOUNTER — Other Ambulatory Visit: Payer: Self-pay

## 2021-11-19 DIAGNOSIS — M6281 Muscle weakness (generalized): Secondary | ICD-10-CM | POA: Diagnosis not present

## 2021-11-19 DIAGNOSIS — M545 Low back pain, unspecified: Secondary | ICD-10-CM | POA: Diagnosis not present

## 2021-11-19 DIAGNOSIS — R262 Difficulty in walking, not elsewhere classified: Secondary | ICD-10-CM | POA: Diagnosis not present

## 2021-11-19 NOTE — Therapy (Signed)
OUTPATIENT PHYSICAL THERAPY TREATMENT NOTE/Discharge   Patient Name: Elaine Harper MRN: [id number] DOB:1955-12-15, 66 y.o., female Today's Date: 11/19/2021  PCP: Lajean Manes, MD REFERRING PROVIDER: Lajean Manes, MD   PT End of Session - 11/19/21 1421     Visit Number 8    Number of Visits 16    Date for PT Re-Evaluation 12/17/21    Authorization Type [employer] Medicare    PT Start Time 4268    PT Stop Time 1145    PT Time Calculation (min) 43 min    Activity Tolerance Patient tolerated treatment well;No increased pain    Behavior During Therapy WFL for tasks assessed/performed             Past Medical History:  Diagnosis Date   Anxiety    Complication of anesthesia    hard to put to sleep   Hypertension    Menopause    Past Surgical History:  Procedure Laterality Date   ABDOMINAL HYSTERECTOMY     ABLATION ON ENDOMETRIOSIS     LAPAROSCOPIC APPENDECTOMY     MELANOMA EXCISION N/A 09/16/2018   Procedure: WIDE LOCAL EXCISION WITH ADVANCEMENT FLAP CLOSURE;  Surgeon: Stark Klein, MD;  Location: [facility];  Service: General;  Laterality: N/A;   SENTINEL NODE BIOPSY Right 09/16/2018   Procedure: SENTINEL NODE BIOPSY OF RIGHT LOWER LEG MELANOMA;  Surgeon: Stark Klein, MD;  Location: [facility];  Service: General;  Laterality: Right;   There are no problems to display for this patient.    PCP: Lajean Manes, MD   REFERRING PROVIDER: Newman Pies, MD   REFERRING DIAG: [phone] (ICD-10-CM) - Lumbago with sciatica, left side    THERAPY DIAG:  Pain, lumbar region   Muscle weakness (generalized)   Difficulty walking   ONSET DATE: July 2022    SUBJECTIVE:                                                                                                                                                                                            SUBJECTIVE STATEMENT: Pt states that things are not much better since starting PT. She  has been working on her HEP and will feel good for a little bit, but then her LE symptoms will return. She now notes fairly consistent tingling in the Lt foot.    PERTINENT HISTORY:  Discectomy and laminectomy on L5-S1 in May 2022   PAIN:  Are you having pain? No VAS scale: 0/10, Worst 10/10 Pain location: L low back Pain orientation: Right and Left  PAIN TYPE: aching Pain description: intermittent  Aggravating factors: walking, standing  too long;  Relieving factors: resting, ibuprofen, ice, sitting for a while    PRECAUTIONS: None   WEIGHT BEARING RESTRICTIONS No   FALLS:  Has patient fallen in last 6 months? No,    LIVING ENVIRONMENT: Lives with: lives with their family Lives in: House/apartment Stairs: Yes; 3 floors Has following equipment at home: None   OCCUPATION: retired   PLOF: Independent   PATIENT GOALS : Pt walks 3-4 miles a day for exercise. Pt would like to exercise without pain.      OBJECTIVE:    DIAGNOSTIC FINDINGS:  L5-S1:Postsurgical changes from right laminectomy and micro discectomy with interval resolution of the right subarticular extruded disc component. T1 hypointense, enhancing tissue in is now seen in right central/subarticular zone, consistent with postsurgical granulation tissue.No spinal canal or neural foraminal stenosis. Disc uncovering and prominent hypertrophic facet degenerative changes with and prominent left-sided ligamentum flavum redundancy resulting in mild left subarticular zone stenosis and moderate left neural foraminal narrowing, progressed since prior MRI.   IMPRESSION: 1. Postsurgical changes at L5-S1 with interval resolution of right subarticular disc extrusion, noting enhancement in this location, likely representing postsurgical granulation tissue. 2. Moderate left neural foraminal narrowing at L5-S1, progressed from prior MRI, potentially impinging on the exiting left L5 nerve root.   PATIENT SURVEYS:  FOTO 52  (70 at eval)    SCREENING FOR RED FLAGS: Bowel or bladder incontinence: No Spinal tumors: No Cauda equina syndrome: No Compression fracture: No Abdominal aneurysm: No   COGNITION:          Overall cognitive status: Within functional limits for tasks assessed                        SENSATION:          Light touch: Deficits on L             MUSCLE LENGTH: Hamstrings: bilateral limited in supine 90/90 30 deg   LUMBARAROM/PROM   A/PROM A/PROM  09/18/2021  Flexion WFL  Extension 50%   Right lateral flexion   Left lateral flexion   Right rotation WFL  Left rotation WFL   (Blank rows = not tested)  AROM pain free per pt report  LE AROM/PROM:   A/PROM Right 09/18/2021 Left 09/18/2021  Hip external rotation [facility]  Hip extension 5 5  Hip internal rotation 10 deg 10 deg  Knee flexion [facility] WFL  Knee extension [facility] WFL  Ankle dorsiflexion WFL WFL   (Blank rows = not tested)   LE MMT:   MMT Right 09/18/2021 Left 09/18/2021  Hip flexion 5/5 5/5  Hip extension  5/5  5/5  Hip abduction 5/5 4/5  Hip adduction 4/5 4/5  Knee flexion 5/5 5/5  Knee extension 5/5 5/5  Ankle dorsiflexion 5/5 5/5   (Blank rows = not tested)   LUMBAR SPECIAL TESTS:  NT   SPINAL SEGMENTAL MOBILITY ASSESSMENT:     FUNCTIONAL TESTS:     GAIT: Distance walked: 63f Assistive device utilized: None Level of assistance: Complete Independence Comments: decreased trunk rotation, exaggerated lumbar lordosis       TODAY'S TREATMENT    Exercises Supine sciatic nerve flossing with knee 90/90 Supine hooklying hip IR/ER x10 reps  Sidestepping with yellow TB around feet, x10 reps each direction  PT discussion of current HEP and modifications to avoid exacerbation of LE symptoms/pain       PATIENT EDUCATION:  Education details: HEP, POC   Person educated:  Patient Education method: Explanation, Demonstration, Tactile cues, Verbal cues, and Handouts Education comprehension: verbalized  understanding, returned demonstration, verbal cues required, and tactile cues required     HOME EXERCISE PROGRAM:   Access Code: [email or web address] URL: [email or web address] Date: 11/19/2020 Prepared by: Sherol Dade   ASSESSMENT:   CLINICAL IMPRESSION: Patient is a 66 y.o. female who was evaluated by physical therapy for LBP with radiculopathy on 09/18/21. Pt has been diligently completing /her HEP and has made some improvements in her LE strength since starting PT. Although she would initially note temporary improvements in her pain and other symptoms, her pain would return after 1-2 days. She has also had peripheralization of her LE symptoms into the foot which are now fairly constant. Due to her lack of consistent progress and remaining limitations with daily activity, she is being discharged from PT and encouraged to follow back up with her referring physician for other possible treatment options. Pt is in agreement with this and PT spent the session making necessary updates to her HEP for continued flexibility/strength improvements on her own.  Objective impairments include Abnormal gait, decreased activity tolerance, decreased endurance, difficulty walking, decreased ROM, decreased strength, hypomobility, increased fascial restrictions, increased muscle spasms, impaired flexibility, impaired sensation, improper body mechanics, postural dysfunction, and pain. These impairments are limiting patient from cleaning, community activity, yard work, shopping, and exercise . Personal factors including Age, Fitness, Past/current experiences, and Time since onset of injury/illness/exacerbation are also affecting patient's functional outcome. Patient will benefit from skilled PT to address above impairments and improve overall function.   REHAB POTENTIAL: Good   CLINICAL DECISION MAKING: Stable/uncomplicated   EVALUATION COMPLEXITY: Low     GOALS:     SHORT TERM GOALS:   STG Name Target  Date Goal status  1 Pt will become independent with HEP in order to demonstrate synthesis of PT education.    10/02/2021 MET  2 Pt will report at least 2 pt reduction on NPRS scale for pain in order to demonstrate functional improvement with household activity, self care, and ADL.     10/30/2021 Ongoing   3/ Pt will score at least 3 pt increase on FOTO to demonstrate functional improvement in MCII and pt perceived function.      10/30/2021 NOt met     LONG TERM GOALS:    LTG Name Target Date Goal status  1 Pt  will become independent with final HEP in order to demonstrate synthesis of PT education.   12/11/2021 Not met   2 Pt will be able to demonstrate/report ability to walk >35 mins or 3 miles without pain in order to demonstrate functional improvement and tolerance to exercise and community mobility.     12/11/2021 Not met   3 Pt will score >/= 69 on FOTO to demonstrate functional improvement in LBP.     12/11/2021 Not met   4 Pt will be able to perform 5XSTS in under 12s  in order to demonstrate functional improvement above the cut off score for adults.    12/11/2021 Not tested    PLAN: PT FREQUENCY: 1-2x/week   PT DURATION: 12 weeks (likely D/C by 8)   PLANNED INTERVENTIONS: Therapeutic exercises, Therapeutic activity, Neuro Muscular re-education, Balance training, Gait training, Patient/Family education, Joint mobilization, Stair training, Orthotic/Fit training, Aquatic Therapy, Dry Needling, Electrical stimulation, Spinal mobilization, Cryotherapy, Moist heat, scar mobilization, Taping, Vasopneumatic device, Traction, Ultrasound, and Ionotophoresis 35m/ml Dexamethasone   PLAN FOR NEXT SESSION: pt d/c to referring MD  PHYSICAL THERAPY  DISCHARGE SUMMARY  Visits from Start of Care:   Current functional level related to goals / functional outcomes: See above for more details     Remaining deficits: See above for more details    Education / Equipment: See above for more  details   Patient agrees to discharge. Patient goals were partially met. Patient is being discharged due to lack of progress.   2:34 PM,11/19/21 [place], [place] at [place]

## 2021-12-10 DIAGNOSIS — M4316 Spondylolisthesis, lumbar region: Secondary | ICD-10-CM | POA: Diagnosis not present

## 2021-12-25 DIAGNOSIS — H2513 Age-related nuclear cataract, bilateral: Secondary | ICD-10-CM | POA: Diagnosis not present

## 2021-12-25 DIAGNOSIS — H00012 Hordeolum externum right lower eyelid: Secondary | ICD-10-CM | POA: Diagnosis not present

## 2021-12-25 DIAGNOSIS — H5213 Myopia, bilateral: Secondary | ICD-10-CM | POA: Diagnosis not present

## 2022-01-07 DIAGNOSIS — M5417 Radiculopathy, lumbosacral region: Secondary | ICD-10-CM | POA: Diagnosis not present

## 2022-01-30 DIAGNOSIS — M5417 Radiculopathy, lumbosacral region: Secondary | ICD-10-CM | POA: Diagnosis not present

## 2022-02-21 DIAGNOSIS — M5417 Radiculopathy, lumbosacral region: Secondary | ICD-10-CM | POA: Diagnosis not present

## 2022-02-21 DIAGNOSIS — G8929 Other chronic pain: Secondary | ICD-10-CM | POA: Diagnosis not present

## 2022-02-26 DIAGNOSIS — M5116 Intervertebral disc disorders with radiculopathy, lumbar region: Secondary | ICD-10-CM | POA: Diagnosis not present

## 2022-02-26 DIAGNOSIS — M25552 Pain in left hip: Secondary | ICD-10-CM | POA: Diagnosis not present

## 2022-02-26 DIAGNOSIS — M9903 Segmental and somatic dysfunction of lumbar region: Secondary | ICD-10-CM | POA: Diagnosis not present

## 2022-02-26 DIAGNOSIS — M9905 Segmental and somatic dysfunction of pelvic region: Secondary | ICD-10-CM | POA: Diagnosis not present

## 2022-02-27 DIAGNOSIS — M9903 Segmental and somatic dysfunction of lumbar region: Secondary | ICD-10-CM | POA: Diagnosis not present

## 2022-02-27 DIAGNOSIS — M9905 Segmental and somatic dysfunction of pelvic region: Secondary | ICD-10-CM | POA: Diagnosis not present

## 2022-02-27 DIAGNOSIS — M25552 Pain in left hip: Secondary | ICD-10-CM | POA: Diagnosis not present

## 2022-02-27 DIAGNOSIS — M5116 Intervertebral disc disorders with radiculopathy, lumbar region: Secondary | ICD-10-CM | POA: Diagnosis not present

## 2022-03-03 DIAGNOSIS — M9903 Segmental and somatic dysfunction of lumbar region: Secondary | ICD-10-CM | POA: Diagnosis not present

## 2022-03-03 DIAGNOSIS — M25552 Pain in left hip: Secondary | ICD-10-CM | POA: Diagnosis not present

## 2022-03-03 DIAGNOSIS — M9905 Segmental and somatic dysfunction of pelvic region: Secondary | ICD-10-CM | POA: Diagnosis not present

## 2022-03-03 DIAGNOSIS — M5116 Intervertebral disc disorders with radiculopathy, lumbar region: Secondary | ICD-10-CM | POA: Diagnosis not present

## 2022-03-04 DIAGNOSIS — M25552 Pain in left hip: Secondary | ICD-10-CM | POA: Diagnosis not present

## 2022-03-04 DIAGNOSIS — M9903 Segmental and somatic dysfunction of lumbar region: Secondary | ICD-10-CM | POA: Diagnosis not present

## 2022-03-04 DIAGNOSIS — M9905 Segmental and somatic dysfunction of pelvic region: Secondary | ICD-10-CM | POA: Diagnosis not present

## 2022-03-04 DIAGNOSIS — M5116 Intervertebral disc disorders with radiculopathy, lumbar region: Secondary | ICD-10-CM | POA: Diagnosis not present

## 2022-03-06 DIAGNOSIS — M5116 Intervertebral disc disorders with radiculopathy, lumbar region: Secondary | ICD-10-CM | POA: Diagnosis not present

## 2022-03-06 DIAGNOSIS — M9905 Segmental and somatic dysfunction of pelvic region: Secondary | ICD-10-CM | POA: Diagnosis not present

## 2022-03-06 DIAGNOSIS — M25552 Pain in left hip: Secondary | ICD-10-CM | POA: Diagnosis not present

## 2022-03-06 DIAGNOSIS — M9903 Segmental and somatic dysfunction of lumbar region: Secondary | ICD-10-CM | POA: Diagnosis not present

## 2022-03-10 DIAGNOSIS — M25552 Pain in left hip: Secondary | ICD-10-CM | POA: Diagnosis not present

## 2022-03-10 DIAGNOSIS — M5116 Intervertebral disc disorders with radiculopathy, lumbar region: Secondary | ICD-10-CM | POA: Diagnosis not present

## 2022-03-10 DIAGNOSIS — M9905 Segmental and somatic dysfunction of pelvic region: Secondary | ICD-10-CM | POA: Diagnosis not present

## 2022-03-10 DIAGNOSIS — M9903 Segmental and somatic dysfunction of lumbar region: Secondary | ICD-10-CM | POA: Diagnosis not present

## 2022-03-11 DIAGNOSIS — M9905 Segmental and somatic dysfunction of pelvic region: Secondary | ICD-10-CM | POA: Diagnosis not present

## 2022-03-11 DIAGNOSIS — M25552 Pain in left hip: Secondary | ICD-10-CM | POA: Diagnosis not present

## 2022-03-11 DIAGNOSIS — M9903 Segmental and somatic dysfunction of lumbar region: Secondary | ICD-10-CM | POA: Diagnosis not present

## 2022-03-11 DIAGNOSIS — M5116 Intervertebral disc disorders with radiculopathy, lumbar region: Secondary | ICD-10-CM | POA: Diagnosis not present

## 2022-03-13 DIAGNOSIS — M9903 Segmental and somatic dysfunction of lumbar region: Secondary | ICD-10-CM | POA: Diagnosis not present

## 2022-03-13 DIAGNOSIS — M9905 Segmental and somatic dysfunction of pelvic region: Secondary | ICD-10-CM | POA: Diagnosis not present

## 2022-03-13 DIAGNOSIS — M5116 Intervertebral disc disorders with radiculopathy, lumbar region: Secondary | ICD-10-CM | POA: Diagnosis not present

## 2022-03-13 DIAGNOSIS — M25552 Pain in left hip: Secondary | ICD-10-CM | POA: Diagnosis not present

## 2022-03-17 DIAGNOSIS — M5116 Intervertebral disc disorders with radiculopathy, lumbar region: Secondary | ICD-10-CM | POA: Diagnosis not present

## 2022-03-17 DIAGNOSIS — M9905 Segmental and somatic dysfunction of pelvic region: Secondary | ICD-10-CM | POA: Diagnosis not present

## 2022-03-17 DIAGNOSIS — M9903 Segmental and somatic dysfunction of lumbar region: Secondary | ICD-10-CM | POA: Diagnosis not present

## 2022-03-17 DIAGNOSIS — M25552 Pain in left hip: Secondary | ICD-10-CM | POA: Diagnosis not present

## 2022-03-18 DIAGNOSIS — M25552 Pain in left hip: Secondary | ICD-10-CM | POA: Diagnosis not present

## 2022-03-18 DIAGNOSIS — M9903 Segmental and somatic dysfunction of lumbar region: Secondary | ICD-10-CM | POA: Diagnosis not present

## 2022-03-18 DIAGNOSIS — M9905 Segmental and somatic dysfunction of pelvic region: Secondary | ICD-10-CM | POA: Diagnosis not present

## 2022-03-18 DIAGNOSIS — M5116 Intervertebral disc disorders with radiculopathy, lumbar region: Secondary | ICD-10-CM | POA: Diagnosis not present

## 2022-03-19 DIAGNOSIS — M9903 Segmental and somatic dysfunction of lumbar region: Secondary | ICD-10-CM | POA: Diagnosis not present

## 2022-03-19 DIAGNOSIS — M9905 Segmental and somatic dysfunction of pelvic region: Secondary | ICD-10-CM | POA: Diagnosis not present

## 2022-03-19 DIAGNOSIS — M5116 Intervertebral disc disorders with radiculopathy, lumbar region: Secondary | ICD-10-CM | POA: Diagnosis not present

## 2022-03-19 DIAGNOSIS — M25552 Pain in left hip: Secondary | ICD-10-CM | POA: Diagnosis not present

## 2022-03-25 DIAGNOSIS — M25552 Pain in left hip: Secondary | ICD-10-CM | POA: Diagnosis not present

## 2022-03-25 DIAGNOSIS — H43812 Vitreous degeneration, left eye: Secondary | ICD-10-CM | POA: Diagnosis not present

## 2022-03-25 DIAGNOSIS — M5116 Intervertebral disc disorders with radiculopathy, lumbar region: Secondary | ICD-10-CM | POA: Diagnosis not present

## 2022-03-25 DIAGNOSIS — M9905 Segmental and somatic dysfunction of pelvic region: Secondary | ICD-10-CM | POA: Diagnosis not present

## 2022-03-25 DIAGNOSIS — M9903 Segmental and somatic dysfunction of lumbar region: Secondary | ICD-10-CM | POA: Diagnosis not present

## 2022-03-26 DIAGNOSIS — M9905 Segmental and somatic dysfunction of pelvic region: Secondary | ICD-10-CM | POA: Diagnosis not present

## 2022-03-26 DIAGNOSIS — M5116 Intervertebral disc disorders with radiculopathy, lumbar region: Secondary | ICD-10-CM | POA: Diagnosis not present

## 2022-03-26 DIAGNOSIS — M9903 Segmental and somatic dysfunction of lumbar region: Secondary | ICD-10-CM | POA: Diagnosis not present

## 2022-03-26 DIAGNOSIS — M25552 Pain in left hip: Secondary | ICD-10-CM | POA: Diagnosis not present

## 2022-03-27 ENCOUNTER — Encounter (INDEPENDENT_AMBULATORY_CARE_PROVIDER_SITE_OTHER): Payer: Self-pay

## 2022-03-31 DIAGNOSIS — M5116 Intervertebral disc disorders with radiculopathy, lumbar region: Secondary | ICD-10-CM | POA: Diagnosis not present

## 2022-03-31 DIAGNOSIS — M9903 Segmental and somatic dysfunction of lumbar region: Secondary | ICD-10-CM | POA: Diagnosis not present

## 2022-03-31 DIAGNOSIS — M9905 Segmental and somatic dysfunction of pelvic region: Secondary | ICD-10-CM | POA: Diagnosis not present

## 2022-03-31 DIAGNOSIS — M25552 Pain in left hip: Secondary | ICD-10-CM | POA: Diagnosis not present

## 2022-04-02 DIAGNOSIS — M5116 Intervertebral disc disorders with radiculopathy, lumbar region: Secondary | ICD-10-CM | POA: Diagnosis not present

## 2022-04-02 DIAGNOSIS — M9903 Segmental and somatic dysfunction of lumbar region: Secondary | ICD-10-CM | POA: Diagnosis not present

## 2022-04-02 DIAGNOSIS — M9905 Segmental and somatic dysfunction of pelvic region: Secondary | ICD-10-CM | POA: Diagnosis not present

## 2022-04-02 DIAGNOSIS — M25552 Pain in left hip: Secondary | ICD-10-CM | POA: Diagnosis not present

## 2022-04-07 DIAGNOSIS — M9905 Segmental and somatic dysfunction of pelvic region: Secondary | ICD-10-CM | POA: Diagnosis not present

## 2022-04-07 DIAGNOSIS — M9903 Segmental and somatic dysfunction of lumbar region: Secondary | ICD-10-CM | POA: Diagnosis not present

## 2022-04-07 DIAGNOSIS — M5116 Intervertebral disc disorders with radiculopathy, lumbar region: Secondary | ICD-10-CM | POA: Diagnosis not present

## 2022-04-07 DIAGNOSIS — M25552 Pain in left hip: Secondary | ICD-10-CM | POA: Diagnosis not present

## 2022-04-10 ENCOUNTER — Encounter (INDEPENDENT_AMBULATORY_CARE_PROVIDER_SITE_OTHER): Payer: Medicare Other | Admitting: Ophthalmology

## 2022-04-10 ENCOUNTER — Encounter (INDEPENDENT_AMBULATORY_CARE_PROVIDER_SITE_OTHER): Payer: Self-pay | Admitting: Ophthalmology

## 2022-04-10 ENCOUNTER — Ambulatory Visit (INDEPENDENT_AMBULATORY_CARE_PROVIDER_SITE_OTHER): Payer: Medicare Other | Admitting: Ophthalmology

## 2022-04-10 DIAGNOSIS — H2513 Age-related nuclear cataract, bilateral: Secondary | ICD-10-CM

## 2022-04-10 DIAGNOSIS — H43812 Vitreous degeneration, left eye: Secondary | ICD-10-CM | POA: Diagnosis not present

## 2022-04-10 NOTE — Patient Instructions (Signed)

## 2022-04-10 NOTE — Progress Notes (Signed)
04/10/2022     CHIEF COMPLAINT Patient presents for  Chief Complaint  Patient presents with   Retina Evaluation      HISTORY OF PRESENT ILLNESS: Elaine Harper is a 66 y.o. female who presents to the clinic today for:   HPI     Retina Evaluation           Associated Symptoms: Floaters and Distortion.  Negative for Flashes, Blind Spot, Pain, Redness, Photophobia, Glare, Trauma, Scalp Tenderness, Jaw Claudication, Shoulder/Hip pain, Fever, Weight Loss and Fatigue         Comments   NP- Flashes- no tears- Ref by [place]. Pt stated, "when I woke up from [place] day weekend, I noticed a black line on the right side of my left eye. Then I noticed a flash of light in my left eye and that went on for a couple of days. I called [name] and she told me there were no retinal tears but wanted me to be evaulatyed with Dr. Zadie Rhine. I don't see it as much but it is still there. Right after I had a huge headache. And then I saw a big floater, I thought it was a big bug. I still see floater till this day." Pt is currently taking Ofloxacin as needed.  Pt denies pain.      Last edited by Silvestre Moment on 04/10/2022  9:05 AM.      Referring physician: Luberta Mutter, MD [address],  [place] [id number]  HISTORICAL INFORMATION:   Selected notes from the MEDICAL RECORD NUMBER       CURRENT MEDICATIONS: No current outpatient medications on file. (Ophthalmic Drugs)   No current facility-administered medications for this visit. (Ophthalmic Drugs)   Current Outpatient Medications (Other)  Medication Sig   cholecalciferol (VITAMIN D) 1000 units tablet Take 1,000 Units by mouth daily.   estradiol (ESTRACE) 1 MG tablet Take 1 mg by mouth daily.   fluorouracil (EFUDEX) 5 % cream as needed.   hydrochlorothiazide (HYDRODIURIL) 25 MG tablet Take 12.5 mg by mouth daily.   lisinopril (PRINIVIL,ZESTRIL) 10 MG tablet Take 10 mg by mouth daily.   LORazepam (ATIVAN) 1 MG tablet daily  as needed.   LORazepam (ATIVAN) 1 MG tablet Take 1 tablet by mouth at bedtime as needed   naproxen sodium (ANAPROX) 220 MG tablet Take 220 mg by mouth 2 (two) times daily with a meal.   oxyCODONE (OXY IR/ROXICODONE) 5 MG immediate release tablet Take 1 tablet (5 mg total) by mouth every 6 (six) hours as needed for severe pain.   spironolactone (ALDACTONE) 50 MG tablet Take 50 mg by mouth daily.   spironolactone (ALDACTONE) 50 MG tablet TAKE 1 TABLET BY MOUTH DAILY   spironolactone (ALDACTONE) 50 MG tablet TAKE 1 TABLET BY MOUTH DAILY   tretinoin (RETIN-A) 0.025 % cream as needed.   Turmeric 450 MG CAPS Take by mouth daily.   No current facility-administered medications for this visit. (Other)      REVIEW OF SYSTEMS: ROS   Negative for: Constitutional, Gastrointestinal, Neurological, Skin, Genitourinary, Musculoskeletal, HENT, Endocrine, Cardiovascular, Eyes, Respiratory, Psychiatric, Allergic/Imm, Heme/Lymph Last edited by Silvestre Moment on 04/10/2022  9:00 AM.       ALLERGIES No Known Allergies  PAST MEDICAL HISTORY Past Medical History:  Diagnosis Date   Anxiety    Complication of anesthesia    hard to put to sleep   Hypertension    Menopause    Past Surgical History:  Procedure  Laterality Date   ABDOMINAL HYSTERECTOMY     ABLATION ON ENDOMETRIOSIS     LAPAROSCOPIC APPENDECTOMY     MELANOMA EXCISION N/A 09/16/2018   Procedure: WIDE LOCAL EXCISION WITH ADVANCEMENT FLAP CLOSURE;  Surgeon: Stark Klein, MD;  Location: [facility];  Service: General;  Laterality: N/A;   SENTINEL NODE BIOPSY Right 09/16/2018   Procedure: SENTINEL NODE BIOPSY OF RIGHT LOWER LEG MELANOMA;  Surgeon: Stark Klein, MD;  Location: [facility];  Service: General;  Laterality: Right;    FAMILY HISTORY Family History  Problem Relation Age of Onset   Hypertension Mother    Alzheimer's disease Father    Hypertension Sister    Cirrhosis Brother        alcoholism     SOCIAL HISTORY Social History   Tobacco Use   Smoking status: Former    Types: Cigarettes    Quit date: 04/22/1987    Years since quitting: 34.9   Smokeless tobacco: Never  Substance Use Topics   Alcohol use: Yes    Comment: 3 glasses wine per night   Drug use: No         OPHTHALMIC EXAM:  Base Eye Exam     Visual Acuity (ETDRS)       Right Left   Dist cc 20/20 20/25 +1    Correction: Glasses         Tonometry (Tonopen, 9:05 AM)       Right Left   Pressure 15 14         Pupils       Pupils Dark Light Shape React APD   Right PERRL 4 3 Round Brisk None   Left PERRL 4 3 Round Brisk None         Visual Fields       Left Right    Full Full         Extraocular Movement       Right Left    Full Full         Neuro/Psych     Oriented x3: Yes         Dilation     Both eyes: 1.0% Mydriacyl, 2.5% Phenylephrine @ 9:05 AM           Slit Lamp and Fundus Exam     External Exam       Right Left   External Normal Normal         Slit Lamp Exam       Right Left   Lids/Lashes Lid margin change superiorly from old basal cell carcinoma resection Normal   Conjunctiva/Sclera White and quiet White and quiet   Cornea Clear Clear   Anterior Chamber Deep and quiet Deep and quiet   Iris Round and reactive Round and reactive   Lens 1+ Nuclear sclerosis 1+ Nuclear sclerosis   Anterior Vitreous Normal Normal         Fundus Exam       Right Left   Posterior Vitreous Normal Normal, no pigment   Disc Normal Normal   C/D Ratio 0.25 0.1   Macula Normal Normal   Vessels Normal Normal   Periphery Drusen no holes or tears, scleral depression, 25 D, 20 D exam, Drusen            IMAGING AND PROCEDURES  Imaging and Procedures for 04/10/22  OCT, Retina - OU - Both Eyes       Right Eye Quality was good. Scan  locations included subfoveal. Central Foveal Thickness: 289. Progression has no prior data. Findings include normal foveal  contour.   Left Eye Quality was good. Scan locations included subfoveal. Central Foveal Thickness: 293. Progression has no prior data. Findings include normal foveal contour.   Notes PVD minor vitreous debris OS, normal maculae      Color Fundus Photography Optos - OU - Both Eyes       Right Eye Progression has no prior data. Disc findings include normal observations. Macula : normal observations. Vessels : normal observations. Periphery : normal observations.   Left Eye Progression has no prior data. Disc findings include normal observations. Macula : normal observations. Vessels : normal observations. Periphery : normal observations.   Notes Incidental PVD seen near nerve OS              ASSESSMENT/PLAN:  Nuclear sclerotic cataract of both eyes OU minor, follow-up [name] as scheduled  Posterior vitreous detachment of left eye  The nature of posterior vitreous detachment was discussed with the patient as well as its physiology, its age prevalence, and its possible implication regarding retinal breaks and detachment.  An informational brochure was offered to the patient.  All the patient's questions were answered.  The patient was asked to return if new or different flashes or floaters develops.   Patient was instructed to contact office immediately if any new changes were noticed. I explained to the patient that vitreous inside the eye is similar to jello inside a bowl. As the jello melts it can start to pull away from the bowl, similarly the vitreous throughout our lives can begin to pull away from the retina. That process is called a posterior vitreous detachment. In some cases, the vitreous can tug hard enough on the retina to form a retinal tear. I discussed with the patient the signs and symptoms of a retinal detachment.  Do not rub the eye.       ICD-10-CM   1. Posterior vitreous detachment of left eye  H43.812 OCT, Retina - OU - Both Eyes    Color Fundus  Photography Optos - OU - Both Eyes    2. Nuclear sclerotic cataract of both eyes  H25.13       1.  No holes or tears careful examination in each eye.  2.  3.  Ophthalmic Meds Ordered this visit:  No orders of the defined types were placed in this encounter.      Return in about 6 weeks (around 05/22/2022) for dilate, OS, COLOR FP.  Patient Instructions   The nature of posterior vitreous detachment was discussed with the patient as well as its physiology, its age prevalence, and its possible implication regarding retinal breaks and detachment.  An informational brochure was offered to the patient.  All the patient's questions were answered.  The patient was asked to return if new or different flashes or floaters develops.   Patient was instructed to contact office immediately if any new changes were noticed. I explained to the patient that vitreous inside the eye is similar to jello inside a bowl. As the jello melts it can start to pull away from the bowl, similarly the vitreous throughout our lives can begin to pull away from the retina. That process is called a posterior vitreous detachment. In some cases, the vitreous can tug hard enough on the retina to form a retinal tear. I discussed with the patient the signs and symptoms of a retinal detachment.  Do not  rub the eye.     Explained the diagnoses, plan, and follow up with the patient and they expressed understanding.  Patient expressed understanding of the importance of proper follow up care.   Clent Demark Christabel Camire M.D. Diseases & Surgery of the Retina and Vitreous Retina [facility] Diabetic [place] 04/10/22     Abbreviations: M myopia (nearsighted); A astigmatism; H hyperopia (farsighted); P presbyopia; Mrx spectacle prescription;  CTL contact lenses; OD right eye; OS left eye; OU both eyes  XT exotropia; ET esotropia; PEK punctate epithelial keratitis; PEE punctate epithelial erosions; DES dry eye syndrome; MGD meibomian gland  dysfunction; ATs artificial tears; PFAT's preservative free artificial tears; [place] nuclear sclerotic cataract; PSC posterior subcapsular cataract; ERM epi-retinal membrane; PVD posterior vitreous detachment; RD retinal detachment; DM diabetes mellitus; DR diabetic retinopathy; NPDR non-proliferative diabetic retinopathy; PDR proliferative diabetic retinopathy; CSME clinically significant macular edema; DME diabetic macular edema; dbh dot blot hemorrhages; CWS cotton wool spot; POAG primary open angle glaucoma; C/D cup-to-disc ratio; HVF humphrey visual field; GVF goldmann visual field; OCT optical coherence tomography; IOP intraocular pressure; BRVO Branch retinal vein occlusion; CRVO central retinal vein occlusion; CRAO central retinal artery occlusion; BRAO branch retinal artery occlusion; RT retinal tear; SB scleral buckle; PPV pars plana vitrectomy; VH Vitreous hemorrhage; PRP panretinal laser photocoagulation; IVK intravitreal kenalog; VMT vitreomacular traction; MH Macular hole;  NVD neovascularization of the disc; NVE neovascularization elsewhere; AREDS age related eye disease study; ARMD age related macular degeneration; POAG primary open angle glaucoma; EBMD epithelial/anterior basement membrane dystrophy; ACIOL anterior chamber intraocular lens; IOL intraocular lens; PCIOL posterior chamber intraocular lens; Phaco/IOL phacoemulsification with intraocular lens placement; [place] photorefractive keratectomy; LASIK laser assisted in situ keratomileusis; HTN hypertension; DM diabetes mellitus; COPD chronic obstructive pulmonary disease

## 2022-04-10 NOTE — Assessment & Plan Note (Signed)

## 2022-04-10 NOTE — Assessment & Plan Note (Signed)
OU minor, follow-up [name] as scheduled

## 2022-04-14 DIAGNOSIS — M25552 Pain in left hip: Secondary | ICD-10-CM | POA: Diagnosis not present

## 2022-04-14 DIAGNOSIS — M9903 Segmental and somatic dysfunction of lumbar region: Secondary | ICD-10-CM | POA: Diagnosis not present

## 2022-04-14 DIAGNOSIS — M5116 Intervertebral disc disorders with radiculopathy, lumbar region: Secondary | ICD-10-CM | POA: Diagnosis not present

## 2022-04-14 DIAGNOSIS — M9905 Segmental and somatic dysfunction of pelvic region: Secondary | ICD-10-CM | POA: Diagnosis not present

## 2022-05-09 DIAGNOSIS — Z1231 Encounter for screening mammogram for malignant neoplasm of breast: Secondary | ICD-10-CM | POA: Diagnosis not present

## 2022-05-14 DIAGNOSIS — C4441 Basal cell carcinoma of skin of scalp and neck: Secondary | ICD-10-CM | POA: Diagnosis not present

## 2022-05-14 DIAGNOSIS — Z85820 Personal history of malignant melanoma of skin: Secondary | ICD-10-CM | POA: Diagnosis not present

## 2022-05-14 DIAGNOSIS — C44719 Basal cell carcinoma of skin of left lower limb, including hip: Secondary | ICD-10-CM | POA: Diagnosis not present

## 2022-05-14 DIAGNOSIS — C44619 Basal cell carcinoma of skin of left upper limb, including shoulder: Secondary | ICD-10-CM | POA: Diagnosis not present

## 2022-05-14 DIAGNOSIS — L821 Other seborrheic keratosis: Secondary | ICD-10-CM | POA: Diagnosis not present

## 2022-05-14 DIAGNOSIS — Z85828 Personal history of other malignant neoplasm of skin: Secondary | ICD-10-CM | POA: Diagnosis not present

## 2022-05-14 DIAGNOSIS — D1801 Hemangioma of skin and subcutaneous tissue: Secondary | ICD-10-CM | POA: Diagnosis not present

## 2022-05-14 DIAGNOSIS — D0461 Carcinoma in situ of skin of right upper limb, including shoulder: Secondary | ICD-10-CM | POA: Diagnosis not present

## 2022-05-14 DIAGNOSIS — L570 Actinic keratosis: Secondary | ICD-10-CM | POA: Diagnosis not present

## 2022-05-23 DIAGNOSIS — I10 Essential (primary) hypertension: Secondary | ICD-10-CM | POA: Diagnosis not present

## 2022-05-23 DIAGNOSIS — M79644 Pain in right finger(s): Secondary | ICD-10-CM | POA: Diagnosis not present

## 2022-05-23 DIAGNOSIS — I471 Supraventricular tachycardia: Secondary | ICD-10-CM | POA: Diagnosis not present

## 2022-05-23 DIAGNOSIS — E559 Vitamin D deficiency, unspecified: Secondary | ICD-10-CM | POA: Diagnosis not present

## 2022-05-23 DIAGNOSIS — Z79899 Other long term (current) drug therapy: Secondary | ICD-10-CM | POA: Diagnosis not present

## 2022-05-23 DIAGNOSIS — M79645 Pain in left finger(s): Secondary | ICD-10-CM | POA: Diagnosis not present

## 2022-05-23 DIAGNOSIS — Z0000 Encounter for general adult medical examination without abnormal findings: Secondary | ICD-10-CM | POA: Diagnosis not present

## 2022-05-23 DIAGNOSIS — K219 Gastro-esophageal reflux disease without esophagitis: Secondary | ICD-10-CM | POA: Diagnosis not present

## 2022-05-26 ENCOUNTER — Encounter (INDEPENDENT_AMBULATORY_CARE_PROVIDER_SITE_OTHER): Payer: Self-pay | Admitting: Ophthalmology

## 2022-05-26 ENCOUNTER — Ambulatory Visit (INDEPENDENT_AMBULATORY_CARE_PROVIDER_SITE_OTHER): Payer: Medicare Other | Admitting: Ophthalmology

## 2022-05-26 DIAGNOSIS — H43812 Vitreous degeneration, left eye: Secondary | ICD-10-CM

## 2022-05-26 DIAGNOSIS — H2513 Age-related nuclear cataract, bilateral: Secondary | ICD-10-CM

## 2022-05-26 NOTE — Progress Notes (Signed)
05/26/2022     CHIEF COMPLAINT Patient presents for  Chief Complaint  Patient presents with   Retina Evaluation      HISTORY OF PRESENT ILLNESS: Elaine Harper is a 66 y.o. female who presents to the clinic today for:   HPI     Retina Evaluation           Laterality: left eye         Comments   Pt states her vision has been stable  Pt denies any new floaters or FOL       Last edited by Hurman Horn, MD on 05/26/2022  1:40 PM.     No change in the prior 6 weeks and symptomatology Referring physician: Luberta Mutter, MD [place],  [place] [address]  HISTORICAL INFORMATION:   Selected notes from the [facility]: No current outpatient medications on file. (Ophthalmic Drugs)   No current facility-administered medications for this visit. (Ophthalmic Drugs)   Current Outpatient Medications (Other)  Medication Sig   cholecalciferol (VITAMIN D) 1000 units tablet Take 1,000 Units by mouth daily.   estradiol (ESTRACE) 1 MG tablet Take 1 mg by mouth daily.   fluorouracil (EFUDEX) 5 % cream as needed.   hydrochlorothiazide (HYDRODIURIL) 25 MG tablet Take 12.5 mg by mouth daily.   lisinopril (PRINIVIL,ZESTRIL) 10 MG tablet Take 10 mg by mouth daily.   LORazepam (ATIVAN) 1 MG tablet daily as needed.   LORazepam (ATIVAN) 1 MG tablet Take 1 tablet by mouth at bedtime as needed   naproxen sodium (ANAPROX) 220 MG tablet Take 220 mg by mouth 2 (two) times daily with a meal.   oxyCODONE (OXY IR/ROXICODONE) 5 MG immediate release tablet Take 1 tablet (5 mg total) by mouth every 6 (six) hours as needed for severe pain.   spironolactone (ALDACTONE) 50 MG tablet Take 50 mg by mouth daily.   spironolactone (ALDACTONE) 50 MG tablet TAKE 1 TABLET BY MOUTH DAILY   spironolactone (ALDACTONE) 50 MG tablet TAKE 1 TABLET BY MOUTH DAILY   tretinoin (RETIN-A) 0.025 % cream as needed.   Turmeric 450 MG CAPS Take by mouth daily.    No current facility-administered medications for this visit. (Other)      REVIEW OF SYSTEMS: ROS   Negative for: Constitutional, Gastrointestinal, Neurological, Skin, Genitourinary, Musculoskeletal, HENT, Endocrine, Cardiovascular, Eyes, Respiratory, Psychiatric, Allergic/Imm, Heme/Lymph Last edited by Hurman Horn, MD on 05/26/2022  1:36 PM.       ALLERGIES No Known Allergies  PAST MEDICAL HISTORY Past Medical History:  Diagnosis Date   Anxiety    Complication of anesthesia    hard to put to sleep   Hypertension    Menopause    Past Surgical History:  Procedure Laterality Date   ABDOMINAL HYSTERECTOMY     ABLATION ON ENDOMETRIOSIS     LAPAROSCOPIC APPENDECTOMY     MELANOMA EXCISION N/A 09/16/2018   Procedure: WIDE LOCAL EXCISION WITH ADVANCEMENT FLAP CLOSURE;  Surgeon: Stark Klein, MD;  Location: [facility];  Service: General;  Laterality: N/A;   SENTINEL NODE BIOPSY Right 09/16/2018   Procedure: SENTINEL NODE BIOPSY OF RIGHT LOWER LEG MELANOMA;  Surgeon: Stark Klein, MD;  Location: [facility];  Service: General;  Laterality: Right;    FAMILY HISTORY Family History  Problem Relation Age of Onset   Hypertension Mother    Alzheimer's disease Father    Hypertension Sister  Cirrhosis Brother        alcoholism    SOCIAL HISTORY Social History   Tobacco Use   Smoking status: Former    Types: Cigarettes    Quit date: 04/22/1987    Years since quitting: 35.1   Smokeless tobacco: Never  Substance Use Topics   Alcohol use: Yes    Comment: 3 glasses wine per night   Drug use: No         OPHTHALMIC EXAM:  Base Eye Exam     Visual Acuity (ETDRS)       Right Left   Dist [place] 20/20 20/25         Tonometry (Tonopen, 1:15 PM)       Right Left   Pressure 15 13         Neuro/Psych     Oriented x3: Yes         Dilation     Left eye: 2.5% Phenylephrine, 1.0% Mydriacyl @ 1:12 PM           Slit Lamp  and Fundus Exam     External Exam       Right Left   External Normal Normal         Slit Lamp Exam       Right Left   Lids/Lashes Lid margin change superiorly from old basal cell carcinoma resection Normal   Conjunctiva/Sclera White and quiet White and quiet   Cornea Clear Clear   Anterior Chamber Deep and quiet Deep and quiet   Iris Round and reactive Round and reactive   Lens 1+ Nuclear sclerosis 1+ Nuclear sclerosis   Anterior Vitreous Normal Normal         Fundus Exam       Right Left   Posterior Vitreous Normal no pigment, Posterior vitreous detachment, Central vitreous floaters, no change over time   Disc Normal Normal   C/D Ratio 0.25 0.1   Macula Normal Normal   Vessels Normal Normal   Periphery Drusen no holes or tears, scleral depression, 25 D, 20 D exam, Drusen            IMAGING AND PROCEDURES  Imaging and Procedures for 05/26/22  Color Fundus Photography Optos - OU - Both Eyes       Right Eye Progression has no prior data. Disc findings include normal observations. Macula : normal observations. Vessels : normal observations. Periphery : normal observations.   Left Eye Progression has no prior data. Disc findings include normal observations. Macula : normal observations. Vessels : normal observations. Periphery : normal observations.   Notes Incidental PVD seen near nerve OS             ASSESSMENT/PLAN:  Posterior vitreous detachment of left eye No change over time, no holes or tears  Nuclear sclerotic cataract of both eyes Moderate OU     ICD-10-CM   1. Posterior vitreous detachment of left eye  H43.812 Color Fundus Photography Optos - OU - Both Eyes    2. Nuclear sclerotic cataract of both eyes  H25.13       1.  OS, no signs of hole or tear follow-up [name]  2.  Schedule follow-up.  In 2 years or simply as needed.  Patient is option simply to remain with [name] full-time and here on a as needed  basis  3.  Ophthalmic Meds Ordered this visit:  No orders of the defined types were placed in this encounter.  Return in about 2 years (around 05/26/2024) for DILATE OU, COLOR FP, OCT.  There are no Patient Instructions on file for this visit.   Explained the diagnoses, plan, and follow up with the patient and they expressed understanding.  Patient expressed understanding of the importance of proper follow up care.   Elaine Harper M.D. Diseases & Surgery of the Retina and Vitreous [facility] 05/26/22     Abbreviations: M myopia (nearsighted); A astigmatism; H hyperopia (farsighted); P presbyopia; Mrx spectacle prescription;  CTL contact lenses; OD right eye; OS left eye; OU both eyes  XT exotropia; ET esotropia; PEK punctate epithelial keratitis; PEE punctate epithelial erosions; DES dry eye syndrome; MGD meibomian gland dysfunction; ATs artificial tears; PFAT's preservative free artificial tears; [place] nuclear sclerotic cataract; PSC posterior subcapsular cataract; ERM epi-retinal membrane; PVD posterior vitreous detachment; RD retinal detachment; DM diabetes mellitus; DR diabetic retinopathy; NPDR non-proliferative diabetic retinopathy; PDR proliferative diabetic retinopathy; CSME clinically significant macular edema; DME diabetic macular edema; dbh dot blot hemorrhages; CWS cotton wool spot; POAG primary open angle glaucoma; C/D cup-to-disc ratio; HVF humphrey visual field; GVF goldmann visual field; OCT optical coherence tomography; IOP intraocular pressure; BRVO Branch retinal vein occlusion; CRVO central retinal vein occlusion; CRAO central retinal artery occlusion; BRAO branch retinal artery occlusion; RT retinal tear; SB scleral buckle; PPV pars plana vitrectomy; VH Vitreous hemorrhage; PRP panretinal laser photocoagulation; IVK intravitreal kenalog; VMT vitreomacular traction; MH Macular hole;  NVD neovascularization of the disc; NVE neovascularization  elsewhere; AREDS age related eye disease study; ARMD age related macular degeneration; POAG primary open angle glaucoma; EBMD epithelial/anterior basement membrane dystrophy; ACIOL anterior chamber intraocular lens; IOL intraocular lens; PCIOL posterior chamber intraocular lens; Phaco/IOL phacoemulsification with intraocular lens placement; [place] photorefractive keratectomy; LASIK laser assisted in situ keratomileusis; HTN hypertension; DM diabetes mellitus; COPD chronic obstructive pulmonary disease

## 2022-05-26 NOTE — Assessment & Plan Note (Signed)
Moderate OU

## 2022-05-26 NOTE — Assessment & Plan Note (Signed)
No change over time, no holes or tears

## 2022-06-03 DIAGNOSIS — H9313 Tinnitus, bilateral: Secondary | ICD-10-CM | POA: Diagnosis not present

## 2022-06-03 DIAGNOSIS — H903 Sensorineural hearing loss, bilateral: Secondary | ICD-10-CM | POA: Diagnosis not present

## 2022-06-10 DIAGNOSIS — M79644 Pain in right finger(s): Secondary | ICD-10-CM | POA: Diagnosis not present

## 2022-06-10 DIAGNOSIS — M180 Bilateral primary osteoarthritis of first carpometacarpal joints: Secondary | ICD-10-CM | POA: Diagnosis not present

## 2022-06-10 DIAGNOSIS — M79645 Pain in left finger(s): Secondary | ICD-10-CM | POA: Diagnosis not present

## 2022-06-10 DIAGNOSIS — G8929 Other chronic pain: Secondary | ICD-10-CM | POA: Diagnosis not present

## 2022-06-17 DIAGNOSIS — C4441 Basal cell carcinoma of skin of scalp and neck: Secondary | ICD-10-CM | POA: Diagnosis not present

## 2022-08-07 DIAGNOSIS — M4316 Spondylolisthesis, lumbar region: Secondary | ICD-10-CM | POA: Diagnosis not present

## 2022-08-07 DIAGNOSIS — M5442 Lumbago with sciatica, left side: Secondary | ICD-10-CM | POA: Diagnosis not present

## 2022-08-07 DIAGNOSIS — G8929 Other chronic pain: Secondary | ICD-10-CM | POA: Diagnosis not present

## 2022-09-15 DIAGNOSIS — M5127 Other intervertebral disc displacement, lumbosacral region: Secondary | ICD-10-CM | POA: Diagnosis not present

## 2022-09-15 DIAGNOSIS — M5417 Radiculopathy, lumbosacral region: Secondary | ICD-10-CM | POA: Diagnosis not present

## 2022-12-31 ENCOUNTER — Other Ambulatory Visit: Payer: Self-pay | Admitting: Neurosurgery

## 2023-01-15 ENCOUNTER — Other Ambulatory Visit: Payer: Self-pay | Admitting: Neurosurgery

## 2023-01-16 NOTE — Pre-Procedure Instructions (Signed)
Surgical Instructions    Your procedure is scheduled on January 26, 2023.  Report to [facility] Main Entrance "A" at 9:00 A.M., then check in with the Admitting office.  Call this number if you have problems the morning of surgery:  [phone]  If you have any questions prior to your surgery date call [phone]: Open Monday-Friday 8am-4pm If you experience any cold or flu symptoms such as cough, fever, chills, shortness of breath, etc. between now and your scheduled surgery, please notify us at the above number.     Remember:  Do not eat or drink after midnight the night before your surgery     Take these medicines the morning of surgery with A SIP OF WATER:  estradiol (ESTRACE)   famotidine (PEPCID)   gabapentin (NEURONTIN)   rosuvastatin (CRESTOR)    May take these medicines IF NEEDED:  LORazepam (ATIVAN)   ofloxacin (OCUFLOX) ophthalmic solution   Polyethylene Glycol 400 (BLINK TEARS)    As of today, STOP taking any Aspirin (unless otherwise instructed by your surgeon) Aleve, Naproxen, Ibuprofen, Motrin, Advil, Goody's, BC's, all herbal medications, fish oil, and all vitamins.                     Do NOT Smoke (Tobacco/Vaping) for 24 hours prior to your procedure.  If you use a CPAP at night, you may bring your mask/headgear for your overnight stay.   Contacts, glasses, piercing's, hearing aid's, dentures or partials may not be worn into surgery, please bring cases for these belongings.    For patients admitted to the hospital, discharge time will be determined by your treatment team.   Patients discharged the day of surgery will not be allowed to drive home, and someone needs to stay with them for 24 hours.  SURGICAL WAITING ROOM VISITATION Patients having surgery or a procedure may have no more than 2 support people in the waiting area - these visitors may rotate.   Children under the age of 63 must have an adult with them who is not the patient. If the patient needs  to stay at the hospital during part of their recovery, the visitor guidelines for inpatient rooms apply. Pre-op nurse will coordinate an appropriate time for 1 support person to accompany patient in pre-op.  This support person may not rotate.   Please refer to the [facility] website for the visitor guidelines for Inpatients (after your surgery is over and you are in a regular room).    Special instructions:   [place]- Preparing For Surgery  Before surgery, you can play an important role. Because skin is not sterile, your skin needs to be as free of germs as possible. You can reduce the number of germs on your skin by washing with CHG (chlorahexidine gluconate) Soap before surgery.  CHG is an antiseptic cleaner which kills germs and bonds with the skin to continue killing germs even after washing.    Oral Hygiene is also important to reduce your risk of infection.  Remember - BRUSH YOUR TEETH THE MORNING OF SURGERY WITH YOUR REGULAR TOOTHPASTE  Please do not use if you have an allergy to CHG or antibacterial soaps. If your skin becomes reddened/irritated stop using the CHG.  Do not shave (including legs and underarms) for at least 48 hours prior to first CHG shower. It is OK to shave your face.  Please follow these instructions carefully.   Shower the NIGHT BEFORE SURGERY and the MORNING OF SURGERY  If you chose to wash your hair, wash your hair first as usual with your normal shampoo.  After you shampoo, rinse your hair and body thoroughly to remove the shampoo.  Use CHG Soap as you would any other liquid soap. You can apply CHG directly to the skin and wash gently with a scrungie or a clean washcloth.   Apply the CHG Soap to your body ONLY FROM THE NECK DOWN.  Do not use on open wounds or open sores. Avoid contact with your eyes, ears, mouth and genitals (private parts). Wash Face and genitals (private parts)  with your normal soap.   Wash thoroughly, paying special attention to the  area where your surgery will be performed.  Thoroughly rinse your body with warm water from the neck down.  DO NOT shower/wash with your normal soap after using and rinsing off the CHG Soap.  Pat yourself dry with a CLEAN TOWEL.  Wear CLEAN PAJAMAS to bed the night before surgery  Place CLEAN SHEETS on your bed the night before your surgery  DO NOT SLEEP WITH PETS.   Day of Surgery: Take a shower with CHG soap. Do not wear jewelry or makeup Do not wear lotions, powders, perfumes/colognes, or deodorant. Do not shave 48 hours prior to surgery.  Men may shave face and neck. Do not bring valuables to the hospital.  [facility] is not responsible for any belongings or valuables. Do not wear nail polish, gel polish, artificial nails, or any other type of covering on natural nails (fingers and toes) If you have artificial nails or gel coating that need to be removed by a nail salon, please have this removed prior to surgery. Artificial nails or gel coating may interfere with anesthesia's ability to adequately monitor your vital signs.  Wear Clean/Comfortable clothing the morning of surgery Remember to brush your teeth WITH YOUR REGULAR TOOTHPASTE.   Please read over the following fact sheets that you were given.    If you received a COVID test during your pre-op visit  it is requested that you wear a mask when out in public, stay away from anyone that may not be feeling well and notify your surgeon if you develop symptoms. If you have been in contact with anyone that has tested positive in the last 10 days please notify you surgeon.

## 2023-01-19 ENCOUNTER — Other Ambulatory Visit: Payer: Self-pay

## 2023-01-19 ENCOUNTER — Encounter (HOSPITAL_COMMUNITY)
Admission: RE | Admit: 2023-01-19 | Discharge: 2023-01-19 | Disposition: A | Discharge status: Home / Self Care | Payer: Medicare Other | Source: Ambulatory Visit | Attending: Neurosurgery | Admitting: Neurosurgery

## 2023-01-19 ENCOUNTER — Other Ambulatory Visit (HOSPITAL_COMMUNITY): Payer: Medicare Other

## 2023-01-19 ENCOUNTER — Encounter (HOSPITAL_COMMUNITY): Payer: Self-pay

## 2023-01-19 VITALS — BP 141/80 | HR 76 | Temp 98.3°F | Resp 18 | Ht 66.0 in | Wt 164.7 lb

## 2023-01-19 DIAGNOSIS — Z01818 Encounter for other preprocedural examination: Secondary | ICD-10-CM | POA: Diagnosis not present

## 2023-01-19 DIAGNOSIS — I10 Essential (primary) hypertension: Secondary | ICD-10-CM | POA: Diagnosis not present

## 2023-01-19 HISTORY — DX: Gastro-esophageal reflux disease without esophagitis: K21.9

## 2023-01-19 LAB — TYPE AND SCREEN
ABO/RH(D): B NEG
Antibody Screen: NEGATIVE

## 2023-01-19 LAB — BASIC METABOLIC PANEL
Anion gap: 9 (ref 5–15)
BUN: 18 mg/dL (ref 8–23)
CO2: 25 mmol/L (ref 22–32)
Calcium: 9.4 mg/dL (ref 8.9–10.3)
Chloride: 100 mmol/L (ref 98–111)
Creatinine, Ser: 0.92 mg/dL (ref 0.44–1.00)
GFR, Estimated: >60 mL/min (ref >60)
Glucose, Bld: 103 mg/dL — ABNORMAL HIGH (ref 70–99)
Potassium: 4.6 mmol/L (ref 3.5–5.1)
Sodium: 134 mmol/L — ABNORMAL LOW (ref 135–145)

## 2023-01-19 LAB — CBC
HCT: 42.5 % (ref 36.0–46.0)
Hemoglobin: 14.6 g/dL (ref 12.0–15.0)
MCH: 30.7 pg (ref 26.0–34.0)
MCHC: 34.4 g/dL (ref 30.0–36.0)
MCV: 89.5 fL (ref 80.0–100.0)
Platelets: 321 10*3/uL (ref 150–400)
RBC: 4.75 MIL/uL (ref 3.87–5.11)
RDW: 12 % (ref 11.5–15.5)
WBC: 8.3 10*3/uL (ref 4.0–10.5)
nRBC: 0 % (ref 0.0–0.2)

## 2023-01-19 LAB — SURGICAL PCR SCREEN
MRSA, PCR: NEGATIVE
Staphylococcus aureus: NEGATIVE

## 2023-01-19 NOTE — Progress Notes (Signed)
PCP - Rochele Raring, MD Cardiologist - denies  PPM/ICD - n/a  Chest x-ray - n/a EKG - 01/19/23 Stress Test - 10+ years ago ECHO - denies Cardiac Cath - denies  Sleep Study - denies CPAP - denies  Blood Thinner Instructions: n/a Aspirin Instructions: n/a  NPO at MD  COVID TEST- n/a  Anesthesia review: n/a  Patient denies shortness of breath, fever, cough and chest pain at PAT appointment   All instructions explained to the patient, with a verbal understanding of the material. Patient agrees to go over the instructions while at home for a better understanding. Patient also instructed to self quarantine after being tested for COVID-19. The opportunity to ask questions was provided.

## 2023-01-25 NOTE — Anesthesia Preprocedure Evaluation (Signed)
Anesthesia Evaluation  Patient identified by MRN, date of birth, ID band Patient awake    Reviewed: Allergy & Precautions, NPO status , Patient's Chart, lab work & pertinent test results  History of Anesthesia Complications Negative for: history of anesthetic complications  Airway Mallampati: II  TM Distance: >3 FB Neck ROM: Full    Dental no notable dental hx. (+) Dental Advisory Given   Pulmonary former smoker   Pulmonary exam normal        Cardiovascular hypertension, Pt. on medications Normal cardiovascular exam     Neuro/Psych   Anxiety     negative neurological ROS     GI/Hepatic Neg liver ROS,GERD  Medicated,,  Endo/Other  negative endocrine ROS    Renal/GU negative Renal ROS     Musculoskeletal negative musculoskeletal ROS (+)    Abdominal   Peds  Hematology negative hematology ROS (+)   Anesthesia Other Findings   Reproductive/Obstetrics                             Anesthesia Physical Anesthesia Plan  ASA: 2  Anesthesia Plan: General   Post-op Pain Management: Tylenol PO (pre-op)* and Ketamine IV*   Induction: Intravenous  PONV Risk Score and Plan: 4 or greater and Ondansetron, Dexamethasone, Midazolam and Scopolamine patch - Pre-op  Airway Management Planned: Oral ETT  Additional Equipment:   Intra-op Plan:   Post-operative Plan: Extubation in OR  Informed Consent: I have reviewed the patients History and Physical, chart, labs and discussed the procedure including the risks, benefits and alternatives for the proposed anesthesia with the patient or authorized representative who has indicated his/her understanding and acceptance.     Dental advisory given  Plan Discussed with: Anesthesiologist and CRNA  Anesthesia Plan Comments:        Anesthesia Quick Evaluation

## 2023-01-26 ENCOUNTER — Ambulatory Visit (HOSPITAL_COMMUNITY): Payer: Medicare Other

## 2023-01-26 ENCOUNTER — Ambulatory Visit (HOSPITAL_COMMUNITY)
Admission: RE | Disposition: A | Discharge status: Home / Self Care | Payer: Self-pay | Source: Home / Self Care | Attending: Neurosurgery

## 2023-01-26 ENCOUNTER — Other Ambulatory Visit: Payer: Self-pay

## 2023-01-26 ENCOUNTER — Ambulatory Visit (HOSPITAL_COMMUNITY): Payer: Medicare Other | Admitting: Anesthesiology

## 2023-01-26 ENCOUNTER — Ambulatory Visit (HOSPITAL_COMMUNITY)
Admission: RE | Admit: 2023-01-26 | Discharge: 2023-01-27 | Disposition: A | Discharge status: Home / Self Care | Payer: Medicare Other | Attending: Neurosurgery | Admitting: Neurosurgery

## 2023-01-26 ENCOUNTER — Encounter (HOSPITAL_COMMUNITY): Payer: Self-pay | Admitting: Neurosurgery

## 2023-01-26 ENCOUNTER — Ambulatory Visit (HOSPITAL_BASED_OUTPATIENT_CLINIC_OR_DEPARTMENT_OTHER): Payer: Medicare Other | Admitting: Anesthesiology

## 2023-01-26 DIAGNOSIS — M5417 Radiculopathy, lumbosacral region: Secondary | ICD-10-CM | POA: Diagnosis not present

## 2023-01-26 DIAGNOSIS — M7138 Other bursal cyst, other site: Secondary | ICD-10-CM

## 2023-01-26 DIAGNOSIS — M4317 Spondylolisthesis, lumbosacral region: Secondary | ICD-10-CM | POA: Diagnosis present

## 2023-01-26 DIAGNOSIS — Z87891 Personal history of nicotine dependence: Secondary | ICD-10-CM | POA: Diagnosis not present

## 2023-01-26 DIAGNOSIS — I10 Essential (primary) hypertension: Secondary | ICD-10-CM | POA: Insufficient documentation

## 2023-01-26 DIAGNOSIS — M48062 Spinal stenosis, lumbar region with neurogenic claudication: Secondary | ICD-10-CM | POA: Insufficient documentation

## 2023-01-26 DIAGNOSIS — M4727 Other spondylosis with radiculopathy, lumbosacral region: Secondary | ICD-10-CM | POA: Insufficient documentation

## 2023-01-26 DIAGNOSIS — K219 Gastro-esophageal reflux disease without esophagitis: Secondary | ICD-10-CM | POA: Diagnosis not present

## 2023-01-26 DIAGNOSIS — Z981 Arthrodesis status: Secondary | ICD-10-CM | POA: Diagnosis not present

## 2023-01-26 DIAGNOSIS — M4326 Fusion of spine, lumbar region: Secondary | ICD-10-CM | POA: Diagnosis not present

## 2023-01-26 DIAGNOSIS — M4807 Spinal stenosis, lumbosacral region: Secondary | ICD-10-CM | POA: Insufficient documentation

## 2023-01-26 DIAGNOSIS — F419 Anxiety disorder, unspecified: Secondary | ICD-10-CM | POA: Diagnosis not present

## 2023-01-26 DIAGNOSIS — M5117 Intervertebral disc disorders with radiculopathy, lumbosacral region: Secondary | ICD-10-CM

## 2023-01-26 LAB — ABO/RH: ABO/RH(D): B NEG

## 2023-01-26 SURGERY — POSTERIOR LUMBAR FUSION 1 LEVEL
Anesthesia: General | Site: Spine Lumbar

## 2023-01-26 MED ORDER — CYCLOBENZAPRINE HCL 10 MG PO TABS
ORAL_TABLET | ORAL | Status: AC
  Administered 2023-01-26: 10 mg via ORAL
  Filled 2023-01-26: qty 1

## 2023-01-26 MED ORDER — ONDANSETRON HCL 4 MG/2ML IJ SOLN: 4.0000 mg | Freq: Four times a day (QID) | INTRAMUSCULAR | Status: DC | PRN

## 2023-01-26 MED ORDER — KETAMINE HCL 50 MG/5ML IJ SOSY
PREFILLED_SYRINGE | INTRAMUSCULAR | Status: AC
  Filled 2023-01-26: qty 5

## 2023-01-26 MED ORDER — SCOPOLAMINE 1 MG/3DAYS TD PT72
1.0000 | MEDICATED_PATCH | TRANSDERMAL | Status: DC
  Administered 2023-01-26: 1.5 mg via TRANSDERMAL
  Filled 2023-01-26: qty 1

## 2023-01-26 MED ORDER — PROPOFOL 10 MG/ML IV BOLUS
INTRAVENOUS | Status: AC
  Filled 2023-01-26: qty 20

## 2023-01-26 MED ORDER — ZOLPIDEM TARTRATE 5 MG PO TABS: 5.0000 mg | ORAL_TABLET | Freq: Every evening | ORAL | Status: DC | PRN

## 2023-01-26 MED ORDER — MENTHOL 3 MG MT LOZG: 1.0000 | LOZENGE | OROMUCOSAL | Status: DC | PRN

## 2023-01-26 MED ORDER — ROSUVASTATIN CALCIUM 5 MG PO TABS: 5.0000 mg | ORAL_TABLET | Freq: Every day | ORAL | Status: DC

## 2023-01-26 MED ORDER — CHLORHEXIDINE GLUCONATE CLOTH 2 % EX PADS: 6.0000 | MEDICATED_PAD | Freq: Once | CUTANEOUS | Status: DC

## 2023-01-26 MED ORDER — FENTANYL CITRATE (PF) 250 MCG/5ML IJ SOLN
INTRAMUSCULAR | Status: DC | PRN
  Administered 2023-01-26 (×4): 50 ug via INTRAVENOUS

## 2023-01-26 MED ORDER — FENTANYL CITRATE (PF) 250 MCG/5ML IJ SOLN
INTRAMUSCULAR | Status: AC
  Filled 2023-01-26: qty 5

## 2023-01-26 MED ORDER — GABAPENTIN 300 MG PO CAPS
300.0000 mg | ORAL_CAPSULE | Freq: Three times a day (TID) | ORAL | Status: DC
  Administered 2023-01-26: 300 mg via ORAL
  Filled 2023-01-26: qty 1

## 2023-01-26 MED ORDER — BUPIVACAINE LIPOSOME 1.3 % IJ SUSP
INTRAMUSCULAR | Status: DC | PRN
  Administered 2023-01-26: 20 mL

## 2023-01-26 MED ORDER — CEFAZOLIN SODIUM-DEXTROSE 2-4 GM/100ML-% IV SOLN
2.0000 g | Freq: Three times a day (TID) | INTRAVENOUS | Status: AC
  Administered 2023-01-26 – 2023-01-27 (×2): 2 g via INTRAVENOUS
  Filled 2023-01-26 (×2): qty 100

## 2023-01-26 MED ORDER — SUGAMMADEX SODIUM 200 MG/2ML IV SOLN
INTRAVENOUS | Status: DC | PRN
  Administered 2023-01-26: 200 mg via INTRAVENOUS

## 2023-01-26 MED ORDER — MIDAZOLAM HCL 2 MG/2ML IJ SOLN
INTRAMUSCULAR | Status: AC
  Filled 2023-01-26: qty 2

## 2023-01-26 MED ORDER — THROMBIN 5000 UNITS EX SOLR
CUTANEOUS | Status: AC
  Filled 2023-01-26: qty 5000

## 2023-01-26 MED ORDER — LACTATED RINGERS IV SOLN: INTRAVENOUS | Status: DC

## 2023-01-26 MED ORDER — KETAMINE HCL 10 MG/ML IJ SOLN
INTRAMUSCULAR | Status: DC | PRN
  Administered 2023-01-26: 20 mg via INTRAVENOUS
  Administered 2023-01-26 (×3): 10 mg via INTRAVENOUS

## 2023-01-26 MED ORDER — MIDAZOLAM HCL 2 MG/2ML IJ SOLN
INTRAMUSCULAR | Status: DC | PRN
  Administered 2023-01-26: 2 mg via INTRAVENOUS

## 2023-01-26 MED ORDER — ONDANSETRON HCL 4 MG PO TABS: 4.0000 mg | ORAL_TABLET | Freq: Four times a day (QID) | ORAL | Status: DC | PRN

## 2023-01-26 MED ORDER — BACITRACIN ZINC 500 UNIT/GM EX OINT
TOPICAL_OINTMENT | CUTANEOUS | Status: DC | PRN
  Administered 2023-01-26: 1 via TOPICAL

## 2023-01-26 MED ORDER — PROMETHAZINE HCL 25 MG/ML IJ SOLN: 6.2500 mg | INTRAMUSCULAR | Status: DC | PRN

## 2023-01-26 MED ORDER — BACITRACIN ZINC 500 UNIT/GM EX OINT
TOPICAL_OINTMENT | CUTANEOUS | Status: AC
  Filled 2023-01-26: qty 28.35

## 2023-01-26 MED ORDER — ALBUMIN HUMAN 5 % IV SOLN: INTRAVENOUS | Status: DC | PRN

## 2023-01-26 MED ORDER — BUPIVACAINE-EPINEPHRINE (PF) 0.5% -1:200000 IJ SOLN
INTRAMUSCULAR | Status: DC | PRN
  Administered 2023-01-26: 10 mL via PERINEURAL

## 2023-01-26 MED ORDER — PHENOL 1.4 % MT LIQD: 1.0000 | OROMUCOSAL | Status: DC | PRN

## 2023-01-26 MED ORDER — 0.9 % SODIUM CHLORIDE (POUR BTL) OPTIME
TOPICAL | Status: DC | PRN
  Administered 2023-01-26: 1000 mL

## 2023-01-26 MED ORDER — DEXAMETHASONE SODIUM PHOSPHATE 10 MG/ML IJ SOLN
INTRAMUSCULAR | Status: DC | PRN
  Administered 2023-01-26: 10 mg via INTRAVENOUS

## 2023-01-26 MED ORDER — ACETAMINOPHEN 500 MG PO TABS
1000.0000 mg | ORAL_TABLET | Freq: Once | ORAL | Status: AC
  Administered 2023-01-26: 1000 mg via ORAL
  Filled 2023-01-26: qty 2

## 2023-01-26 MED ORDER — CYCLOBENZAPRINE HCL 10 MG PO TABS
10.0000 mg | ORAL_TABLET | Freq: Three times a day (TID) | ORAL | Status: DC | PRN
  Filled 2023-01-26 (×2): qty 1

## 2023-01-26 MED ORDER — SODIUM CHLORIDE 0.9 % IV SOLN
250.0000 mL | INTRAVENOUS | Status: DC
  Administered 2023-01-26: 250 mL via INTRAVENOUS

## 2023-01-26 MED ORDER — LISINOPRIL 10 MG PO TABS
5.0000 mg | ORAL_TABLET | Freq: Every day | ORAL | Status: DC
  Administered 2023-01-26: 5 mg via ORAL
  Filled 2023-01-26: qty 1

## 2023-01-26 MED ORDER — MORPHINE SULFATE (PF) 4 MG/ML IV SOLN: 4.0000 mg | INTRAVENOUS | Status: DC | PRN

## 2023-01-26 MED ORDER — SPIRONOLACTONE 25 MG PO TABS: 50.0000 mg | ORAL_TABLET | Freq: Every day | ORAL | Status: DC

## 2023-01-26 MED ORDER — FENTANYL CITRATE (PF) 100 MCG/2ML IJ SOLN
25.0000 ug | INTRAMUSCULAR | Status: DC | PRN
  Administered 2023-01-26 (×3): 25 ug via INTRAVENOUS

## 2023-01-26 MED ORDER — CEFAZOLIN SODIUM-DEXTROSE 2-4 GM/100ML-% IV SOLN
2.0000 g | INTRAVENOUS | Status: AC
  Administered 2023-01-26: 2 g via INTRAVENOUS
  Filled 2023-01-26: qty 100

## 2023-01-26 MED ORDER — OXYCODONE HCL 5 MG PO TABS
ORAL_TABLET | ORAL | Status: AC
  Filled 2023-01-26: qty 1

## 2023-01-26 MED ORDER — OXYCODONE HCL 5 MG PO TABS
10.0000 mg | ORAL_TABLET | ORAL | Status: DC | PRN
  Administered 2023-01-26: 10 mg via ORAL
  Filled 2023-01-26: qty 2

## 2023-01-26 MED ORDER — ACETAMINOPHEN 650 MG RE SUPP: 650.0000 mg | RECTAL | Status: DC | PRN

## 2023-01-26 MED ORDER — AMISULPRIDE (ANTIEMETIC) 5 MG/2ML IV SOLN: 10.0000 mg | Freq: Once | INTRAVENOUS | Status: DC | PRN

## 2023-01-26 MED ORDER — DOCUSATE SODIUM 100 MG PO CAPS
100.0000 mg | ORAL_CAPSULE | Freq: Two times a day (BID) | ORAL | Status: DC
  Administered 2023-01-26: 100 mg via ORAL
  Filled 2023-01-26: qty 1

## 2023-01-26 MED ORDER — BUPIVACAINE LIPOSOME 1.3 % IJ SUSP
INTRAMUSCULAR | Status: AC
  Filled 2023-01-26: qty 20

## 2023-01-26 MED ORDER — LIDOCAINE 2% (20 MG/ML) 5 ML SYRINGE
INTRAMUSCULAR | Status: DC | PRN
  Administered 2023-01-26: 80 mg via INTRAVENOUS

## 2023-01-26 MED ORDER — ACETAMINOPHEN 325 MG PO TABS: 650.0000 mg | ORAL_TABLET | ORAL | Status: DC | PRN

## 2023-01-26 MED ORDER — ONDANSETRON HCL 4 MG/2ML IJ SOLN
INTRAMUSCULAR | Status: DC | PRN
  Administered 2023-01-26: 4 mg via INTRAVENOUS

## 2023-01-26 MED ORDER — BUPIVACAINE-EPINEPHRINE (PF) 0.5% -1:200000 IJ SOLN
INTRAMUSCULAR | Status: AC
  Filled 2023-01-26: qty 30

## 2023-01-26 MED ORDER — FENTANYL CITRATE (PF) 100 MCG/2ML IJ SOLN
INTRAMUSCULAR | Status: AC
  Administered 2023-01-26: 25 ug via INTRAVENOUS
  Filled 2023-01-26: qty 2

## 2023-01-26 MED ORDER — ORAL CARE MOUTH RINSE: 15.0000 mL | Freq: Once | OROMUCOSAL | Status: AC

## 2023-01-26 MED ORDER — SODIUM CHLORIDE 0.9% FLUSH: 3.0000 mL | INTRAVENOUS | Status: DC | PRN

## 2023-01-26 MED ORDER — ROCURONIUM BROMIDE 10 MG/ML (PF) SYRINGE
PREFILLED_SYRINGE | INTRAVENOUS | Status: DC | PRN
  Administered 2023-01-26: 20 mg via INTRAVENOUS
  Administered 2023-01-26: 10 mg via INTRAVENOUS
  Administered 2023-01-26 (×2): 15 mg via INTRAVENOUS
  Administered 2023-01-26: 70 mg via INTRAVENOUS

## 2023-01-26 MED ORDER — ESTRADIOL 0.5 MG PO TABS
1.0000 mg | ORAL_TABLET | Freq: Every day | ORAL | Status: DC
  Filled 2023-01-26 (×2): qty 2

## 2023-01-26 MED ORDER — PHENYLEPHRINE 80 MCG/ML (10ML) SYRINGE FOR IV PUSH (FOR BLOOD PRESSURE SUPPORT)
PREFILLED_SYRINGE | INTRAVENOUS | Status: DC | PRN
  Administered 2023-01-26 (×4): 80 ug via INTRAVENOUS

## 2023-01-26 MED ORDER — OXYCODONE HCL 5 MG PO TABS: 5.0000 mg | ORAL_TABLET | ORAL | Status: DC | PRN

## 2023-01-26 MED ORDER — FAMOTIDINE 20 MG PO TABS
20.0000 mg | ORAL_TABLET | Freq: Two times a day (BID) | ORAL | Status: DC
  Administered 2023-01-26: 20 mg via ORAL
  Filled 2023-01-26: qty 1

## 2023-01-26 MED ORDER — CHLORHEXIDINE GLUCONATE 0.12 % MT SOLN
15.0000 mL | Freq: Once | OROMUCOSAL | Status: AC
  Administered 2023-01-26: 15 mL via OROMUCOSAL
  Filled 2023-01-26: qty 15

## 2023-01-26 MED ORDER — CEFAZOLIN SODIUM-DEXTROSE 2-4 GM/100ML-% IV SOLN
INTRAVENOUS | Status: AC
  Filled 2023-01-26: qty 100

## 2023-01-26 MED ORDER — THROMBIN 5000 UNITS EX SOLR
OROMUCOSAL | Status: DC | PRN
  Administered 2023-01-26 (×2): 5 mL via TOPICAL

## 2023-01-26 MED ORDER — ACETAMINOPHEN 500 MG PO TABS
1000.0000 mg | ORAL_TABLET | Freq: Four times a day (QID) | ORAL | Status: DC
  Administered 2023-01-26 – 2023-01-27 (×3): 1000 mg via ORAL
  Filled 2023-01-26 (×3): qty 2

## 2023-01-26 MED ORDER — BISACODYL 10 MG RE SUPP: 10.0000 mg | Freq: Every day | RECTAL | Status: DC | PRN

## 2023-01-26 MED ORDER — PHENYLEPHRINE HCL-NACL 20-0.9 MG/250ML-% IV SOLN
INTRAVENOUS | Status: DC | PRN
  Administered 2023-01-26: 25 ug/min via INTRAVENOUS

## 2023-01-26 MED ORDER — SODIUM CHLORIDE 0.9% FLUSH
3.0000 mL | Freq: Two times a day (BID) | INTRAVENOUS | Status: DC
  Administered 2023-01-26: 3 mL via INTRAVENOUS

## 2023-01-26 MED ORDER — PROPOFOL 10 MG/ML IV BOLUS
INTRAVENOUS | Status: DC | PRN
  Administered 2023-01-26: 140 mg via INTRAVENOUS

## 2023-01-26 SURGICAL SUPPLY — 66 items
APL SKNCLS STERI-STRIP NONHPOA (GAUZE/BANDAGES/DRESSINGS) ×1
BAG COUNTER SPONGE SURGICOUNT (BAG) ×2 IMPLANT
BAG SPNG CNTER NS LX DISP (BAG) ×1
BASKET BONE COLLECTION (BASKET) ×2 IMPLANT
BENZOIN TINCTURE PRP APPL 2/3 (GAUZE/BANDAGES/DRESSINGS) ×2 IMPLANT
BLADE CLIPPER SURG (BLADE) IMPLANT
BUR MATCHSTICK NEURO 3.0 LAGG (BURR) ×2 IMPLANT
BUR PRECISION FLUTE 6.0 (BURR) ×2 IMPLANT
CAGE ALTERA 10X31X9-13 15D (Cage) IMPLANT
CANISTER SUCT 3000ML PPV (MISCELLANEOUS) ×2 IMPLANT
CAP LOCK DLX THRD (Cap) IMPLANT
CNTNR URN SCR LID CUP LEK RST (MISCELLANEOUS) ×2 IMPLANT
CONT SPEC 4OZ STRL OR WHT (MISCELLANEOUS) ×1
COVER BACK TABLE 60X90IN (DRAPES) ×2 IMPLANT
DRAPE C-ARM 42X72 X-RAY (DRAPES) ×4 IMPLANT
DRAPE HALF SHEET 40X57 (DRAPES) ×2 IMPLANT
DRAPE LAPAROTOMY 100X72X124 (DRAPES) ×2 IMPLANT
DRAPE SURG 17X23 STRL (DRAPES) ×8 IMPLANT
DRSG OPSITE POSTOP 4X6 (GAUZE/BANDAGES/DRESSINGS) ×2 IMPLANT
ELECT BLADE 4.0 EZ CLEAN MEGAD (MISCELLANEOUS) ×1
ELECT REM PT RETURN 9FT ADLT (ELECTROSURGICAL) ×1
ELECTRODE BLDE 4.0 EZ CLN MEGD (MISCELLANEOUS) ×2 IMPLANT
ELECTRODE REM PT RTRN 9FT ADLT (ELECTROSURGICAL) ×2 IMPLANT
EVACUATOR 1/8 PVC DRAIN (DRAIN) IMPLANT
GAUZE 4X4 16PLY ~~LOC~~+RFID DBL (SPONGE) ×2 IMPLANT
GLOVE BIO SURGEON STRL SZ 6 (GLOVE) ×2 IMPLANT
GLOVE BIO SURGEON STRL SZ8 (GLOVE) ×4 IMPLANT
GLOVE BIO SURGEON STRL SZ8.5 (GLOVE) ×4 IMPLANT
GLOVE BIOGEL PI IND STRL 6.5 (GLOVE) ×2 IMPLANT
GLOVE EXAM NITRILE XL STR (GLOVE) IMPLANT
GOWN STRL REUS W/ TWL LRG LVL3 (GOWN DISPOSABLE) ×2 IMPLANT
GOWN STRL REUS W/ TWL XL LVL3 (GOWN DISPOSABLE) ×4 IMPLANT
GOWN STRL REUS W/TWL 2XL LVL3 (GOWN DISPOSABLE) IMPLANT
GOWN STRL REUS W/TWL LRG LVL3 (GOWN DISPOSABLE) ×1
GOWN STRL REUS W/TWL XL LVL3 (GOWN DISPOSABLE) ×2
HEMOSTAT POWDER KIT SURGIFOAM (HEMOSTASIS) ×2 IMPLANT
KIT BASIN OR (CUSTOM PROCEDURE TRAY) ×2 IMPLANT
KIT GRAFTMAG DEL NEURO DISP (NEUROSURGERY SUPPLIES) IMPLANT
KIT POSITION SURG JACKSON T1 (MISCELLANEOUS) ×2 IMPLANT
KIT TURNOVER KIT B (KITS) ×2 IMPLANT
NDL HYPO 21X1.5 SAFETY (NEEDLE) IMPLANT
NEEDLE HYPO 21X1.5 SAFETY (NEEDLE) IMPLANT
NEEDLE HYPO 22GX1.5 SAFETY (NEEDLE) ×2 IMPLANT
NS IRRIG 1000ML POUR BTL (IV SOLUTION) ×2 IMPLANT
PACK LAMINECTOMY NEURO (CUSTOM PROCEDURE TRAY) ×2 IMPLANT
PAD ARMBOARD 7.5X6 YLW CONV (MISCELLANEOUS) ×6 IMPLANT
PATTIES SURGICAL .5 X1 (DISPOSABLE) IMPLANT
PUTTY DBM 5CC CALC GRAN (Putty) IMPLANT
ROD CURVED TI 6.35X35 (Rod) IMPLANT
SCREW PA CREO DLX 6.5X45 (Screw) IMPLANT
SCREW PA CREO DLX 6.5X50 (Screw) IMPLANT
SOL ELECTROSURG ANTI STICK (MISCELLANEOUS) ×1
SOLUTION ELECTROSURG ANTI STCK (MISCELLANEOUS) ×2 IMPLANT
SPIKE FLUID TRANSFER (MISCELLANEOUS) ×2 IMPLANT
SPONGE NEURO XRAY DETECT 1X3 (DISPOSABLE) IMPLANT
SPONGE SURGIFOAM ABS GEL 100 (HEMOSTASIS) IMPLANT
SPONGE T-LAP 4X18 ~~LOC~~+RFID (SPONGE) IMPLANT
STRIP CLOSURE SKIN 1/2X4 (GAUZE/BANDAGES/DRESSINGS) ×2 IMPLANT
SUT VIC AB 1 CT1 18XBRD ANBCTR (SUTURE) ×4 IMPLANT
SUT VIC AB 1 CT1 8-18 (SUTURE) ×1
SUT VIC AB 2-0 CP2 18 (SUTURE) ×4 IMPLANT
SYR 20ML LL LF (SYRINGE) IMPLANT
TOWEL GREEN STERILE (TOWEL DISPOSABLE) ×2 IMPLANT
TOWEL GREEN STERILE FF (TOWEL DISPOSABLE) ×2 IMPLANT
TRAY FOLEY MTR SLVR 16FR STAT (SET/KITS/TRAYS/PACK) ×2 IMPLANT
WATER STERILE IRR 1000ML POUR (IV SOLUTION) ×2 IMPLANT

## 2023-01-26 NOTE — H&P (Signed)
Subjective: The patient is a 67 year old white female was complained of back and bilateral leg pain consistent with neurogenic claudication.  She has failed medical management and was worked up with a lumbar MRI which demonstrated lumbosacral spine listhesis and foraminal stenosis.  I discussed the various treatment options with her.  She has decided proceed with surgery.  Past Medical History:  Diagnosis Date   Anxiety    Complication of anesthesia    hard to put to sleep   GERD (gastroesophageal reflux disease)    Hypertension    Menopause     Past Surgical History:  Procedure Laterality Date   ABDOMINAL HYSTERECTOMY     ABLATION ON ENDOMETRIOSIS     FOOT SURGERY  1987   removed part the bone in foot   LAPAROSCOPIC APPENDECTOMY     MELANOMA EXCISION N/A 09/16/2018   Procedure: WIDE LOCAL EXCISION WITH ADVANCEMENT FLAP CLOSURE;  Surgeon: Stark Klein, MD;  Location: [facility];  Service: General;  Laterality: N/A;   MICRODISCECTOMY LUMBAR  2021   Dr. Donavan Burnet NODE BIOPSY Right 09/16/2018   Procedure: SENTINEL NODE BIOPSY OF RIGHT LOWER LEG MELANOMA;  Surgeon: Stark Klein, MD;  Location: [facility];  Service: General;  Laterality: Right;    Allergies  Allergen Reactions   Hydrochlorothiazide Other (See Comments)    Severe muscle cramps.   Lyrica [Pregabalin]     Headache / acid reflux / insomnia / dizziness     Social History   Tobacco Use   Smoking status: Former    Types: Cigarettes    Quit date: 04/22/1987    Years since quitting: 35.7   Smokeless tobacco: Never  Substance Use Topics   Alcohol use: Yes    Comment: 3 glasses wine per night    Family History  Problem Relation Age of Onset   Hypertension Mother    Alzheimer's disease Father    Hypertension Sister    Cirrhosis Brother        alcoholism   Prior to Admission medications   Medication Sig Start Date End Date Taking? Authorizing Provider  calcium elemental as  carbonate (TUMS ULTRA 1000) 400 MG chewable tablet Chew 2,000 mg by mouth daily.   Yes [provider]  cholecalciferol (VITAMIN D) 1000 units tablet Take 1,000 Units by mouth daily.   Yes [provider]  estradiol (ESTRACE) 1 MG tablet Take 1 mg by mouth daily.   Yes [provider]  famotidine (PEPCID) 20 MG tablet Take 20 mg by mouth 2 (two) times daily.   Yes [provider]  gabapentin (NEURONTIN) 300 MG capsule Take 300 mg by mouth 3 (three) times daily.   Yes [provider]  ibuprofen (ADVIL) 200 MG tablet Take 400-600 mg by mouth daily.   Yes [provider]  lisinopril (ZESTRIL) 5 MG tablet Take 5 mg by mouth daily.   Yes [provider]  LORazepam (ATIVAN) 1 MG tablet Take 1 tablet by mouth at bedtime as needed Patient taking differently: Take 0.5 mg by mouth daily as needed for anxiety. 04/16/21  Yes   Magnesium 250 MG TABS Take 250 mg by mouth daily.   Yes [provider]  ofloxacin (OCUFLOX) 0.3 % ophthalmic solution Place 1 drop into the right eye 4 (four) times daily as needed (oil build up).   Yes [provider]  Polyethylene Glycol 400 (BLINK TEARS) 0.25 % SOLN Place 1 drop into both eyes daily as  needed (dry eyes).   Yes [provider]  rosuvastatin (CRESTOR) 5 MG tablet Take 5 mg by mouth daily.   Yes [provider]  spironolactone (ALDACTONE) 50 MG tablet TAKE 1 TABLET BY MOUTH DAILY 10/08/20 01/26/23 Yes Rolm Bookbinder, MD  TURMERIC PO Take 1 tablet by mouth daily.   Yes [provider]  fluorouracil (EFUDEX) 5 % cream Apply 1 Application topically as needed (precancerous spots). 04/16/17   [provider]  spironolactone (ALDACTONE) 50 MG tablet TAKE 1 TABLET BY MOUTH DAILY Patient not taking: Reported on 01/14/2023 07/11/20 01/14/23  Rolm Bookbinder, MD     Review of Systems  Positive ROS: As above  All other systems have been reviewed and were otherwise  negative with the exception of those mentioned in the HPI and as above.  Objective: Vital signs in last 24 hours: Temp:  [98 F (36.7 C)] 98 F (36.7 C) (04/01 0907) Pulse Rate:  [75] 75 (04/01 0907) Resp:  [20] 20 (04/01 0907) BP: (176)/(85) 176/85 (04/01 0907) SpO2:  [99 %] 99 % (04/01 0907) Weight:  [74.4 kg] 74.4 kg (04/01 0907) Estimated body mass index is 25.69 kg/m as calculated from the following:   Height as of this encounter: 5\' 7"  (1.702 m).   Weight as of this encounter: 74.4 kg.   General Appearance: Alert Head: Normocephalic, without obvious abnormality, atraumatic Eyes: PERRL, conjunctiva/corneas clear, EOM's intact,    Ears: Normal  Throat: Normal  Neck: Supple, Back: unremarkable Lungs: Clear to auscultation bilaterally, respirations unlabored Heart: Regular rate and rhythm, no murmur, rub or gallop Abdomen: Soft, non-tender Extremities: Extremities normal, atraumatic, no cyanosis or edema Skin: unremarkable  NEUROLOGIC:   Mental status: alert and oriented,Motor Exam - grossly normal Sensory Exam - grossly normal Reflexes:  Coordination - grossly normal Gait - grossly normal Balance - grossly normal Cranial Nerves: I: smell Not tested  II: visual acuity  OS: Normal  OD: Normal   II: visual fields Full to confrontation  II: pupils Equal, round, reactive to light  III,VII: ptosis None  III,IV,VI: extraocular muscles  Full ROM  V: mastication Normal  V: facial light touch sensation  Normal  V,VII: corneal reflex  Present  VII: facial muscle function - upper  Normal  VII: facial muscle function - lower Normal  VIII: hearing Not tested  IX: soft palate elevation  Normal  IX,X: gag reflex Present  XI: trapezius strength  5/5  XI: sternocleidomastoid strength 5/5  XI: neck flexion strength  5/5  XII: tongue strength  Normal    Data Review Lab Results  Component Value Date   WBC 8.3 01/19/2023   HGB 14.6 01/19/2023   HCT 42.5 01/19/2023   MCV  89.5 01/19/2023   PLT 321 01/19/2023   Lab Results  Component Value Date   NA 134 (L) 01/19/2023   K 4.6 01/19/2023   CL 100 01/19/2023   CO2 25 01/19/2023   BUN 18 01/19/2023   CREATININE 0.92 01/19/2023   GLUCOSE 103 (H) 01/19/2023   No results found for: "INR", "PROTIME"  Assessment/Plan: Lumbosacral spondylolisthesis, lumbosacral foraminal stenosis, neurogenic claudication, lumbar radiculopathy, lumbago: I have discussed the situation with the patient.  I reviewed her imaging studies with her and pointed out the abnormalities.  We have discussed the various treatment options including surgery.  I have described the surgical treatment option of an L5-S1 decompression, instrumentation and fusion.  I have shown her surgical models.  I have given her a surgical pamphlet.  We have discussed the risk, benefits, alternatives, expected postop course, and likelihood of achieving her goals with surgery.  I have answered all her questions.  She has decided proceed with surgery.   Ophelia Charter 01/26/2023 10:20 AM

## 2023-01-26 NOTE — Transfer of Care (Signed)
Immediate Anesthesia Transfer of Care Note  Patient: Elaine Harper  Procedure(s) Performed: PLIF, IP,POSTERIOR INSTRUMENTATION, L5S1 (Spine Lumbar)  Patient Location: PACU  Anesthesia Type:General  Level of Consciousness: sedated  Airway & Oxygen Therapy: Patient Spontanous Breathing and Patient connected to face mask oxygen  Post-op Assessment: Report given to RN and Post -op Vital signs reviewed and stable  Post vital signs: Reviewed and stable  Last Vitals:  Vitals Value Taken Time  BP 135/70 01/26/23 1515  Temp    Pulse 69 01/26/23 1518  Resp 10 01/26/23 1518  SpO2 100 % 01/26/23 1518  Vitals shown include unvalidated device data.  Last Pain:  Vitals:   01/26/23 0914  TempSrc:   PainSc: 3       Patients Stated Pain Goal: 0 ([id number])  Complications: No notable events documented.

## 2023-01-26 NOTE — Anesthesia Procedure Notes (Addendum)
Procedure Name: Intubation Date/Time: 01/26/2023 10:54 AM  Performed by: Heide Scales, CRNAPre-anesthesia Checklist: Patient identified, Emergency Drugs available, Suction available and Patient being monitored Patient Re-evaluated:Patient Re-evaluated prior to induction Oxygen Delivery Method: Circle system utilized Preoxygenation: Pre-oxygenation with 100% oxygen Induction Type: IV induction Ventilation: Mask ventilation without difficulty Laryngoscope Size: Mac and 3 Grade View: Grade I Tube type: Oral Number of attempts: 1 Airway Equipment and Method: Stylet and Oral airway Placement Confirmation: ETT inserted through vocal cords under direct vision, positive ETCO2 and breath sounds checked- equal and bilateral Secured at: 22 cm Tube secured with: Tape Dental Injury: Teeth and Oropharynx as per pre-operative assessment

## 2023-01-26 NOTE — Anesthesia Postprocedure Evaluation (Signed)
Anesthesia Post Note  Patient: Elaine Harper  Procedure(s) Performed: PLIF, IP,POSTERIOR INSTRUMENTATION, L5S1 (Spine Lumbar)     Patient location during evaluation: PACU Anesthesia Type: General Level of consciousness: awake and alert, oriented and patient cooperative Pain management: pain level controlled Vital Signs Assessment: post-procedure vital signs reviewed and stable Respiratory status: spontaneous breathing, nonlabored ventilation and respiratory function stable Cardiovascular status: blood pressure returned to baseline and stable Postop Assessment: no apparent nausea or vomiting Anesthetic complications: no   No notable events documented.  Last Vitals:  Vitals:   01/26/23 1540 01/26/23 1550  BP:    Pulse: 89 90  Resp: 19 (!) 22  Temp:    SpO2: 99% 97%    Last Pain:  Vitals:   01/26/23 1550  TempSrc:   PainSc: 5    Pain Goal: Patients Stated Pain Goal: 0 (01/26/23 0914)    LLE Sensation: Full sensation (01/26/23 1545)   RLE Sensation: Full sensation (01/26/23 1545)        Pervis Hocking

## 2023-01-26 NOTE — Op Note (Signed)
Brief history: The patient is a 67 year old white female on whom I previous performed an L5-S1 discectomy years ago.  She developed recurrent back and bilateral leg pain consistent with neurogenic claudication.  She failed medical management and was worked up with a lumbar MRI which demonstrated an L5-S1 spondylolisthesis, foraminal stenosis, synovial cyst, facet arthropathy, etc.  I discussed the various treatment options with her.  She has decided proceed with surgery.  Preoperative diagnosis: L5-S1 synovial cyst, facet arthropathy degenerative disc disease, spinal stenosis compressing both the L5 and the S1 nerve roots; lumbago; lumbar radiculopathy; neurogenic claudication  Postoperative diagnosis: The same  Procedure: Bilateral redo L5-S1 laminotomy/foraminotomies/medial facetectomy to decompress the bilateral L5 and S1 nerve roots(the work required to do this was in addition to the work required to do the posterior lumbar interbody fusion because of the patient's spinal stenosis, facet arthropathy. Etc. requiring a wide decompression of the nerve roots.);  Left L5-S1 transforaminal lumbar interbody fusion with local morselized autograft bone and Zimmer DBM; insertion of interbody prosthesis at L5-S1 (globus peek expandable interbody prosthesis); posterior nonsegmental instrumentation from L5 to S1 with globus titanium pedicle screws and rods; posterior lateral arthrodesis at L5-S1 with local morselized autograft bone and Zimmer DBM.  Surgeon: Dr. Earle Gell  Asst.: Dr. Kristeen Miss and Arnetha Massy, NP  Anesthesia: Gen. endotracheal  Estimated blood loss: 200 cc  Drains: None  Complications: None  Description of procedure: The patient was brought to the operating room by the anesthesia team. General endotracheal anesthesia was induced. The patient was turned to the prone position on the Wilson frame. The patient's lumbosacral region was then prepared with Betadine scrub and Betadine  solution. Sterile drapes were applied.  I then injected the area to be incised with Marcaine with epinephrine solution. I then used the scalpel to make a linear midline incision over the L5-S1 interspace, incising through her old surgical scar. I then used electrocautery to perform a bilateral subperiosteal dissection exposing the spinous process and lamina of L5-S1. We then obtained intraoperative radiograph to confirm our location. We then inserted the Verstrac retractor to provide exposure.  I began the decompression by using the high speed drill to perform laminotomies at L5-S1 on the left and to widen the previous right L5-S1 laminotomy. We then used the Kerrison punches to widen the laminotomy and removed the epidural scar tissue and ligamentum flavum at L5-S1. We used the Kerrison punches to remove the medial facets at L5-S1, we removed the left L5-S1 facet. We performed wide foraminotomies about the bilateral L5 and S1 nerve roots completing the decompression.  As expected we did encounter a synovial cyst at L5-S1 on the left.  We removed it with a Kerrison punch decompressing the thecal sac.  We now turned our attention to the posterior lumbar interbody fusion. I used a scalpel to incise the intervertebral disc at L5-S1 bilaterally. I then performed a partial intervertebral discectomy at L5-S1 bilaterally using the pituitary forceps. We prepared the vertebral endplates at 075-GRM bilaterally for the fusion by removing the soft tissues with the curettes. We then used the trial spacers to pick the appropriate sized interbody prosthesis. We prefilled his prosthesis with a combination of local morselized autograft bone that we obtained during the decompression as well as Zimmer DBM. We inserted the prefilled prosthesis into the interspace at L5-S1 from the left, we then turned and expanded the prosthesis. There was a good snug fit of the prosthesis in the interspace. We then filled and the remainder  of the  intervertebral disc space with local morselized autograft bone and Zimmer DBM. This completed the posterior lumbar interbody arthrodesis.  During the decompression and insertion of the prosthesis the assistant protected the thecal sac and nerve roots with the D'Errico retractor.  We now turned attention to the instrumentation. Under fluoroscopic guidance we cannulated the bilateral L5 and S1 pedicles with the bone probe. We then removed the bone probe. We then tapped the pedicle with a 5.5 millimeter tap. We then removed the tap. We probed inside the tapped pedicle with a ball probe to rule out cortical breaches. We then inserted a 6.5 x 50 and 45 millimeter pedicle screw into the L5 and S1 pedicles bilaterally under fluoroscopic guidance. We then palpated along the medial aspect of the pedicles to rule out cortical breaches. There were none. The nerve roots were not injured. We then connected the unilateral pedicle screws with a lordotic rod. We compressed the construct and secured the rod in place with the caps. We then tightened the caps appropriately. This completed the instrumentation from L5-S1 bilaterally.  We now turned our attention to the posterior lateral arthrodesis at L5-S1. We used the high-speed drill to decorticate the remainder of the facets, pars, transverse process at L5-S1. We then applied a combination of local morselized autograft bone and Zimmer DBM over these decorticated posterior lateral structures. This completed the posterior lateral arthrodesis.  We then obtained hemostasis using bipolar electrocautery. We irrigated the wound out with saline solution. We inspected the thecal sac and nerve roots and noted they were well decompressed. We then removed the retractor.  We injected Exparel . We reapproximated patient's thoracolumbar fascia with interrupted #1 Vicryl suture. We reapproximated patient's subcutaneous tissue with interrupted 2-0 Vicryl suture. The reapproximated patient's  skin with Steri-Strips and benzoin. The wound was then coated with bacitracin ointment. A sterile dressing was applied. The drapes were removed. The patient was subsequently returned to the supine position where they were extubated by the anesthesia team. He was then transported to the post anesthesia care unit in stable condition. All sponge instrument and needle counts were reportedly correct at the end of this case.

## 2023-01-27 DIAGNOSIS — M48062 Spinal stenosis, lumbar region with neurogenic claudication: Secondary | ICD-10-CM | POA: Diagnosis not present

## 2023-01-27 DIAGNOSIS — Z87891 Personal history of nicotine dependence: Secondary | ICD-10-CM | POA: Diagnosis not present

## 2023-01-27 DIAGNOSIS — M5117 Intervertebral disc disorders with radiculopathy, lumbosacral region: Secondary | ICD-10-CM | POA: Diagnosis not present

## 2023-01-27 DIAGNOSIS — M7138 Other bursal cyst, other site: Secondary | ICD-10-CM | POA: Diagnosis not present

## 2023-01-27 DIAGNOSIS — I10 Essential (primary) hypertension: Secondary | ICD-10-CM | POA: Diagnosis not present

## 2023-01-27 DIAGNOSIS — M4807 Spinal stenosis, lumbosacral region: Secondary | ICD-10-CM | POA: Diagnosis not present

## 2023-01-27 DIAGNOSIS — M4727 Other spondylosis with radiculopathy, lumbosacral region: Secondary | ICD-10-CM | POA: Diagnosis not present

## 2023-01-27 DIAGNOSIS — K219 Gastro-esophageal reflux disease without esophagitis: Secondary | ICD-10-CM | POA: Diagnosis not present

## 2023-01-27 MED ORDER — OXYCODONE-ACETAMINOPHEN 5-325 MG PO TABS: 1.0000 | ORAL_TABLET | ORAL | 0 refills | Status: DC | PRN

## 2023-01-27 MED ORDER — CYCLOBENZAPRINE HCL 10 MG PO TABS: 10.0000 mg | ORAL_TABLET | Freq: Three times a day (TID) | ORAL | 0 refills | Status: DC | PRN

## 2023-01-27 MED ORDER — OXYCODONE-ACETAMINOPHEN 5-325 MG PO TABS: 1.0000 | ORAL_TABLET | ORAL | Status: DC | PRN

## 2023-01-27 MED ORDER — DOCUSATE SODIUM 100 MG PO CAPS: 100.0000 mg | ORAL_CAPSULE | Freq: Two times a day (BID) | ORAL | 0 refills | Status: DC

## 2023-01-27 MED ORDER — OXYCODONE HCL 5 MG PO TABS
5.0000 mg | ORAL_TABLET | ORAL | Status: DC | PRN
  Administered 2023-01-27: 5 mg via ORAL
  Administered 2023-01-27: 10 mg via ORAL
  Filled 2023-01-27: qty 1
  Filled 2023-01-27: qty 2

## 2023-01-27 MED FILL — Thrombin For Soln 5000 Unit: CUTANEOUS | Qty: 5000 | Status: AC

## 2023-01-27 NOTE — Progress Notes (Signed)
Orthopedic Tech Progress Note Patient Details:  Elaine Harper 10-12-1956 [id number]  Ortho Devices Type of Ortho Device: Lumbar corsett Ortho Device/Splint Location: BACK Ortho Device/Splint Interventions: Ordered   Post Interventions Patient Tolerated: Well Instructions Provided: Care of device  Janit Pagan 01/27/2023, 8:08 AM

## 2023-01-27 NOTE — Plan of Care (Signed)

## 2023-01-27 NOTE — Progress Notes (Signed)
Patient alert and oriented, ambulate, VSS, void surgical site clean and dry. D/c instructions explain and given. Pt, d.c home per order

## 2023-01-27 NOTE — Evaluation (Signed)
Physical Therapy Evaluation  Patient Details Name: Elaine Harper MRN: [id number] DOB: Aug 06, 1956 Today's Date: 01/27/2023  History of Present Illness  Pt is a 67 y/o female who presents s/p L5-S1 PLIF on 01/26/2023. PMH significant for HTN, melanoma.  Clinical Impression  Pt admitted with above diagnosis. At the time of PT eval, pt was able to demonstrate transfers and ambulation with gross min guard assist and RW for support. Pt was educated on precautions, brace application/wearing schedule, appropriate activity progression, and car transfer. Pt currently with functional limitations due to the deficits listed below (see PT Problem List). Pt will benefit from skilled PT to increase their independence and safety with mobility to allow discharge to the venue listed below.         Recommendations for follow up therapy are one component of a multi-disciplinary discharge planning process, led by the attending physician.  Recommendations may be updated based on patient status, additional functional criteria and insurance authorization.  Follow Up Recommendations       Assistance Recommended at Discharge PRN  Patient can return home with the following  A little help with walking and/or transfers;A little help with bathing/dressing/bathroom;Assistance with cooking/housework;Assist for transportation;Help with stairs or ramp for entrance    Equipment Recommendations None recommended by PT  Recommendations for Other Services       Functional Status Assessment Patient has had a recent decline in their functional status and demonstrates the ability to make significant improvements in function in a reasonable and predictable amount of time.     Precautions / Restrictions Precautions Precautions: Fall;Back Precaution Booklet Issued: Yes (comment) Precaution Comments: Reviewed handout and pt was cued for precautions during functional mobility. Required Braces or Orthoses: Spinal Brace Spinal  Brace: Lumbar corset;Applied in sitting position Restrictions Weight Bearing Restrictions: No      Mobility  Bed Mobility Overal bed mobility: Needs Assistance Bed Mobility: Rolling, Sidelying to Sit Rolling: Modified independent (Device/Increase time) Sidelying to sit: Supervision       General bed mobility comments: HOB flat and rails lowered to simulate home environment. Increased time and effort required.    Transfers Overall transfer level: Needs assistance Equipment used: Rolling walker (2 wheels) Transfers: Sit to/from Stand Sit to Stand: Min guard           General transfer comment: VC's for hand placement on seated surface for safety. No assist required but close guard provided for safety.    Ambulation/Gait Ambulation/Gait assistance: Min guard Gait Distance (Feet): 300 Feet Assistive device: Rolling walker (2 wheels) Gait Pattern/deviations: Step-through pattern, Decreased stride length, Trunk flexed Gait velocity: Decreased Gait velocity interpretation: <1.31 ft/sec, indicative of household ambulator   General Gait Details: VC's for improved posture, closer walker proximity, and forward gaze. No assist required, no overt LOB noted.  Stairs Stairs: Yes Stairs assistance: Min guard Stair Management: Two rails, Step to pattern, Forwards Number of Stairs: 10 General stair comments: VC's for sequencing and general safety. No assist required but close guard provided for safety.  Wheelchair Mobility    Modified Rankin (Stroke Patients Only)       Balance Overall balance assessment: Needs assistance Sitting-balance support: No upper extremity supported, Feet supported Sitting balance-Leahy Scale: Fair     Standing balance support: Bilateral upper extremity supported, During functional activity, Reliant on assistive device for balance Standing balance-Leahy Scale: Poor  Pertinent Vitals/Pain Pain  Assessment Pain Assessment: Faces Faces Pain Scale: Hurts a little bit Pain Location: Incision site Pain Descriptors / Indicators: Operative site guarding Pain Intervention(s): Limited activity within patient's tolerance, Monitored during session, Repositioned    Home Living Family/patient expects to be discharged to:: Private residence Living Arrangements: Spouse/significant other Available Help at Discharge: Family;Available 24 hours/day Type of Home: House Home Access: Stairs to enter   Entrance Stairs-Number of Steps: 1 Alternate Level Stairs-Number of Steps: flight and elevator present Home Layout: Multi-level;Bed/bath upstairs Home Equipment: Conservation officer, nature (2 wheels);BSC/3in1;Shower seat;Hand held shower head;Adaptive equipment (bidet)      Prior Function Prior Level of Function : Independent/Modified Independent                     Hand Dominance        Extremity/Trunk Assessment   Upper Extremity Assessment Upper Extremity Assessment: Overall WFL for tasks assessed    Lower Extremity Assessment Lower Extremity Assessment: Generalized weakness (mild; consistent with pre-op diagnosis)    Cervical / Trunk Assessment Cervical / Trunk Assessment: Back Surgery  Communication   Communication: No difficulties  Cognition Arousal/Alertness: Awake/alert Behavior During Therapy: WFL for tasks assessed/performed Overall Cognitive Status: Within Functional Limits for tasks assessed                                          General Comments      Exercises     Assessment/Plan    PT Assessment Patient needs continued PT services  PT Problem List Decreased strength;Decreased activity tolerance;Decreased balance;Decreased mobility;Decreased knowledge of use of DME;Decreased knowledge of precautions;Decreased safety awareness;Pain       PT Treatment Interventions DME instruction;Gait training;Stair training;Functional mobility  training;Therapeutic activities;Therapeutic exercise;Balance training;Patient/family education    PT Goals (Current goals can be found in the Care Plan section)  Acute Rehab PT Goals Patient Stated Goal: Home today PT Goal Formulation: With patient Time For Goal Achievement: 02/03/23 Potential to Achieve Goals: Good    Frequency Min 5X/week     Co-evaluation               AM-PAC PT "6 Clicks" Mobility  Outcome Measure Help needed turning from your back to your side while in a flat bed without using bedrails?: None Help needed moving from lying on your back to sitting on the side of a flat bed without using bedrails?: A Little Help needed moving to and from a bed to a chair (including a wheelchair)?: A Little Help needed standing up from a chair using your arms (e.g., wheelchair or bedside chair)?: A Little Help needed to walk in hospital room?: A Little Help needed climbing 3-5 steps with a railing? : A Little 6 Click Score: 19    End of Session Equipment Utilized During Treatment: Gait belt;Back brace Activity Tolerance: Patient tolerated treatment well Patient left: in bed;with call bell/phone within reach Nurse Communication: Mobility status PT Visit Diagnosis: Unsteadiness on feet (R26.81);Pain Pain - part of body:  (back)    Time: [id number] PT Time Calculation (min) (ACUTE ONLY): 31 min   Charges:   PT Evaluation $PT Eval Low Complexity: 1 Low PT Treatments $Gait Training: 8-22 mins        Rolinda Roan, PT, DPT Acute Rehabilitation Services Secure Chat Preferred Office: [phone]   Thelma Comp 01/27/2023, 11:04 AM

## 2023-01-27 NOTE — Discharge Summary (Signed)
Physician Discharge Summary  Patient ID: Elaine Harper MRN: [id number] DOB/AGE: 05-25-1956 67 y.o.  Admit date: 01/26/2023 Discharge date: 01/27/2023  Admission Diagnoses: Lumbosacral spondylolisthesis, lumbosacral facet arthropathy, lumbago, lumbosacral radiculopathy, neurogenic claudication  Discharge Diagnoses: The same Principal Problem:   Spondylolisthesis of lumbosacral region   Discharged Condition: good  Hospital Course: I performed an L5-S1 decompression, instrumentation and fusion on the patient on 01/26/2023.  The surgery went well.  The patient's postoperative course was unremarkable.  On postoperative day #1 she felt well and requested discharge to home.  She was given verbal and written discharge instructions.  All her questions were answered.  Consults: PT, care management Significant Diagnostic Studies: None Treatments: L5-S1 decompression, instrumentation and fusion Discharge Exam: Blood pressure 116/65, pulse 77, temperature 98.4 F (36.9 C), temperature source Oral, resp. rate 16, height 5\' 7"  (1.702 m), weight 74.4 kg, SpO2 94 %. The patient is alert and pleasant.  She looks well.  Her strength is normal.  Her dressing is clean dry.  Disposition: Home  Discharge Instructions     Call MD for:  difficulty breathing, headache or visual disturbances   Complete by: As directed    Call MD for:  extreme fatigue   Complete by: As directed    Call MD for:  hives   Complete by: As directed    Call MD for:  persistant dizziness or light-headedness   Complete by: As directed    Call MD for:  persistant nausea and vomiting   Complete by: As directed    Call MD for:  redness, tenderness, or signs of infection (pain, swelling, redness, odor or green/yellow discharge around incision site)   Complete by: As directed    Call MD for:  severe uncontrolled pain   Complete by: As directed    Call MD for:  temperature >100.4   Complete by: As directed    Diet - low sodium  heart healthy   Complete by: As directed    Discharge instructions   Complete by: As directed    Call [phone] for a followup appointment. Take a stool softener while you are using pain medications.   Driving Restrictions   Complete by: As directed    Do not drive for 2 weeks.   Increase activity slowly   Complete by: As directed    Lifting restrictions   Complete by: As directed    Do not lift more than 5 pounds. No excessive bending or twisting.   May shower / Bathe   Complete by: As directed    Remove the dressing for 3 days after surgery.  You may shower, but leave the incision alone.   Remove dressing in 48 hours   Complete by: As directed       Allergies as of 01/27/2023       Reactions   Hydrochlorothiazide Other (See Comments)   Severe muscle cramps.   Lyrica [pregabalin]    Headache / acid reflux / insomnia / dizziness         Medication List     STOP taking these medications    ibuprofen 200 MG tablet Commonly known as: ADVIL       TAKE these medications    Blink Tears 0.25 % Soln Generic drug: Polyethylene Glycol 400 Place 1 drop into both eyes daily as needed (dry eyes).   cholecalciferol 1000 units tablet Commonly known as: VITAMIN D Take 1,000 Units by mouth daily.   cyclobenzaprine 10 MG tablet Commonly known  as: FLEXERIL Take 1 tablet (10 mg total) by mouth 3 (three) times daily as needed for muscle spasms.   docusate sodium 100 MG capsule Commonly known as: COLACE Take 1 capsule (100 mg total) by mouth 2 (two) times daily.   estradiol 1 MG tablet Commonly known as: ESTRACE Take 1 mg by mouth daily.   famotidine 20 MG tablet Commonly known as: PEPCID Take 20 mg by mouth 2 (two) times daily.   fluorouracil 5 % cream Commonly known as: EFUDEX Apply 1 Application topically as needed (precancerous spots).   gabapentin 300 MG capsule Commonly known as: NEURONTIN Take 300 mg by mouth 3 (three) times daily.   lisinopril 5 MG  tablet Commonly known as: ZESTRIL Take 5 mg by mouth daily.   LORazepam 1 MG tablet Commonly known as: ATIVAN Take 1 tablet by mouth at bedtime as needed What changed:  how much to take how to take this when to take this reasons to take this   Magnesium 250 MG Tabs Take 250 mg by mouth daily.   ofloxacin 0.3 % ophthalmic solution Commonly known as: OCUFLOX Place 1 drop into the right eye 4 (four) times daily as needed (oil build up).   oxyCODONE-acetaminophen 5-325 MG tablet Commonly known as: PERCOCET/ROXICET Take 1-2 tablets by mouth every 4 (four) hours as needed for moderate pain.   rosuvastatin 5 MG tablet Commonly known as: CRESTOR Take 5 mg by mouth daily.   spironolactone 50 MG tablet Commonly known as: ALDACTONE TAKE 1 TABLET BY MOUTH DAILY   spironolactone 50 MG tablet Commonly known as: ALDACTONE TAKE 1 TABLET BY MOUTH DAILY   Tums Ultra 1000 400 MG chewable tablet Generic drug: calcium elemental as carbonate Chew 2,000 mg by mouth daily.   TURMERIC PO Take 1 tablet by mouth daily.         Signed: Ophelia Charter 01/27/2023, 7:45 AM

## 2023-02-04 MED FILL — Sodium Chloride IV Soln 0.9%: INTRAVENOUS | Qty: 1000 | Status: AC

## 2023-02-04 MED FILL — Heparin Sodium (Porcine) Inj 1000 Unit/ML: INTRAMUSCULAR | Qty: 30 | Status: AC

## 2023-02-17 DIAGNOSIS — M4317 Spondylolisthesis, lumbosacral region: Secondary | ICD-10-CM | POA: Diagnosis not present

## 2023-03-24 DIAGNOSIS — M180 Bilateral primary osteoarthritis of first carpometacarpal joints: Secondary | ICD-10-CM | POA: Diagnosis not present

## 2023-04-22 DIAGNOSIS — R252 Cramp and spasm: Secondary | ICD-10-CM | POA: Diagnosis not present

## 2023-05-18 DIAGNOSIS — D485 Neoplasm of uncertain behavior of skin: Secondary | ICD-10-CM | POA: Diagnosis not present

## 2023-05-18 DIAGNOSIS — L814 Other melanin hyperpigmentation: Secondary | ICD-10-CM | POA: Diagnosis not present

## 2023-05-18 DIAGNOSIS — L820 Inflamed seborrheic keratosis: Secondary | ICD-10-CM | POA: Diagnosis not present

## 2023-05-18 DIAGNOSIS — D2271 Melanocytic nevi of right lower limb, including hip: Secondary | ICD-10-CM | POA: Diagnosis not present

## 2023-05-18 DIAGNOSIS — D1801 Hemangioma of skin and subcutaneous tissue: Secondary | ICD-10-CM | POA: Diagnosis not present

## 2023-05-18 DIAGNOSIS — C44519 Basal cell carcinoma of skin of other part of trunk: Secondary | ICD-10-CM | POA: Diagnosis not present

## 2023-05-18 DIAGNOSIS — L570 Actinic keratosis: Secondary | ICD-10-CM | POA: Diagnosis not present

## 2023-05-18 DIAGNOSIS — Z85820 Personal history of malignant melanoma of skin: Secondary | ICD-10-CM | POA: Diagnosis not present

## 2023-05-18 DIAGNOSIS — Z85828 Personal history of other malignant neoplasm of skin: Secondary | ICD-10-CM | POA: Diagnosis not present

## 2023-05-18 DIAGNOSIS — L821 Other seborrheic keratosis: Secondary | ICD-10-CM | POA: Diagnosis not present

## 2023-05-19 DIAGNOSIS — M4317 Spondylolisthesis, lumbosacral region: Secondary | ICD-10-CM | POA: Diagnosis not present

## 2023-05-27 DIAGNOSIS — I4719 Other supraventricular tachycardia: Secondary | ICD-10-CM | POA: Diagnosis not present

## 2023-05-27 DIAGNOSIS — I10 Essential (primary) hypertension: Secondary | ICD-10-CM | POA: Diagnosis not present

## 2023-05-27 DIAGNOSIS — K219 Gastro-esophageal reflux disease without esophagitis: Secondary | ICD-10-CM | POA: Diagnosis not present

## 2023-05-27 DIAGNOSIS — E559 Vitamin D deficiency, unspecified: Secondary | ICD-10-CM | POA: Diagnosis not present

## 2023-05-27 DIAGNOSIS — Z0000 Encounter for general adult medical examination without abnormal findings: Secondary | ICD-10-CM | POA: Diagnosis not present

## 2023-05-27 DIAGNOSIS — Z79899 Other long term (current) drug therapy: Secondary | ICD-10-CM | POA: Diagnosis not present

## 2023-05-28 ENCOUNTER — Encounter (INDEPENDENT_AMBULATORY_CARE_PROVIDER_SITE_OTHER): Payer: Medicare Other | Admitting: Ophthalmology

## 2023-06-01 DIAGNOSIS — Z1231 Encounter for screening mammogram for malignant neoplasm of breast: Secondary | ICD-10-CM | POA: Diagnosis not present

## 2023-06-08 DIAGNOSIS — M180 Bilateral primary osteoarthritis of first carpometacarpal joints: Secondary | ICD-10-CM | POA: Diagnosis not present

## 2023-06-11 IMAGING — MR MR LUMBAR SPINE WO/W CM
4 of 7 series · 20 of 48 positions shown · IV contrast (multihance)
Comparison: MRI of the lumbar spine December 21, 2020.

CLINICAL DATA: Radiculopathy of lumbosacral region.

EXAM:
MRI LUMBAR SPINE WITHOUT AND WITH CONTRAST
TECHNIQUE: Multiplanar and multiecho pulse sequences of the lumbar spine were
obtained without and with intravenous contrast.
CONTRAST:  14mL MULTIHANCE GADOBENATE DIMEGLUMINE 529 MG/ML IV SOLN

[Series 5: T1 · sagittal · 4.0mm · 0.73mm/px · 5 of 17 slices shown (1 of 2)]
[im 1/17]
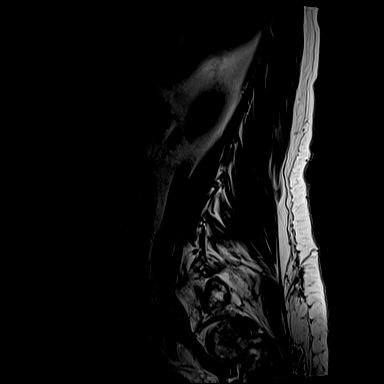
[im 5/17]
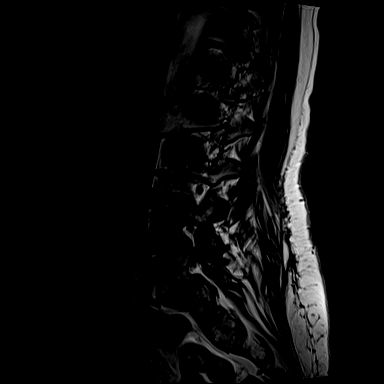
[im 9/17]
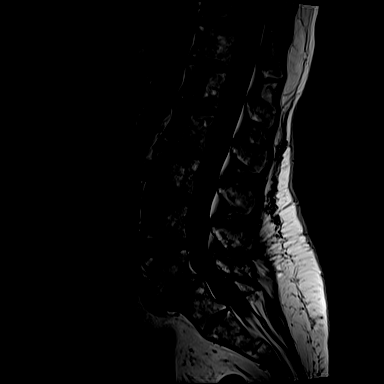
[im 13/17]
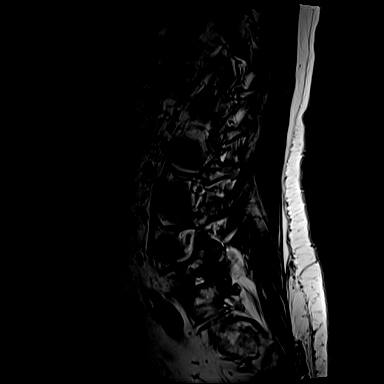
[im 17/17]
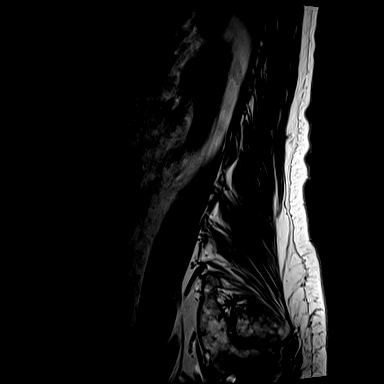

[Series 9: T1 · axial · 4.0mm · 0.28mm/px · z∈[+2,+181]mm · 3 of 41 slices shown (2 of 2)]
[im 5/41]
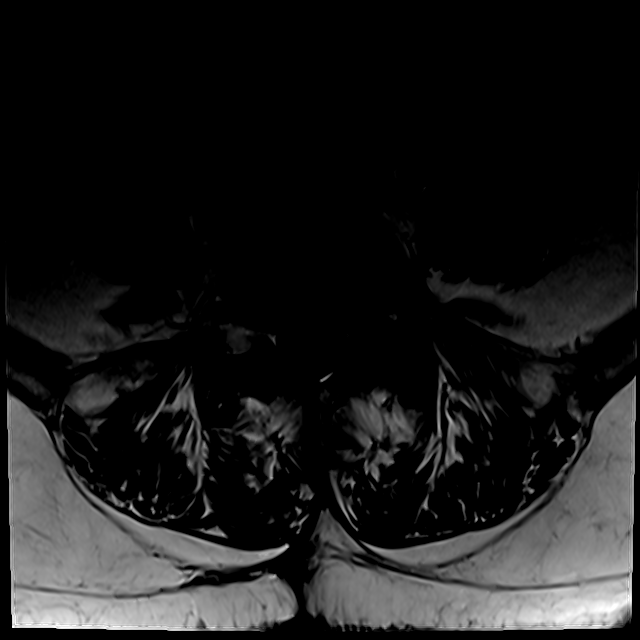
[im 23/41]
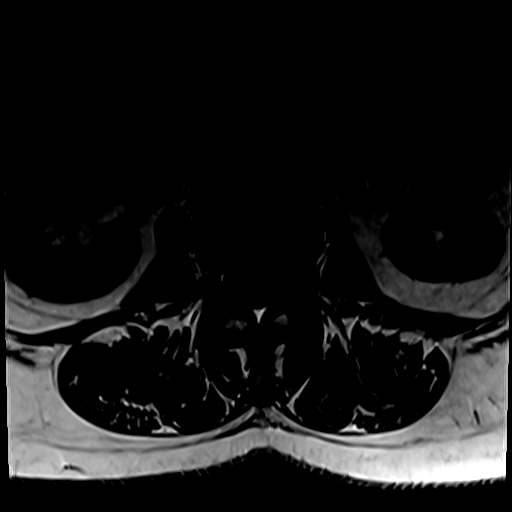
[im 36/41]
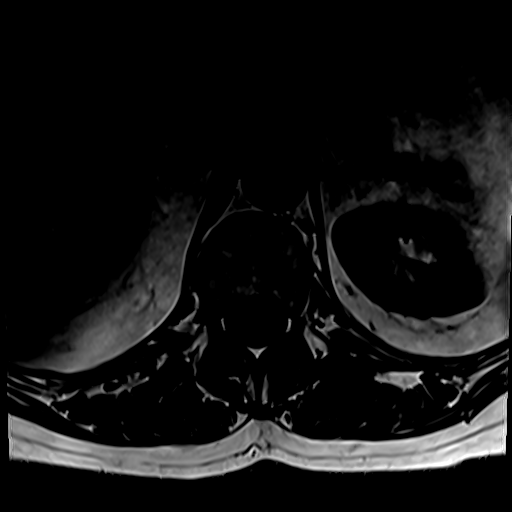

[Series 12: T2 · axial · 4.0mm · 0.28mm/px · z∈[-17,+206]mm · 8 of 41 slices shown (1 of 2)]
[im 1/41]
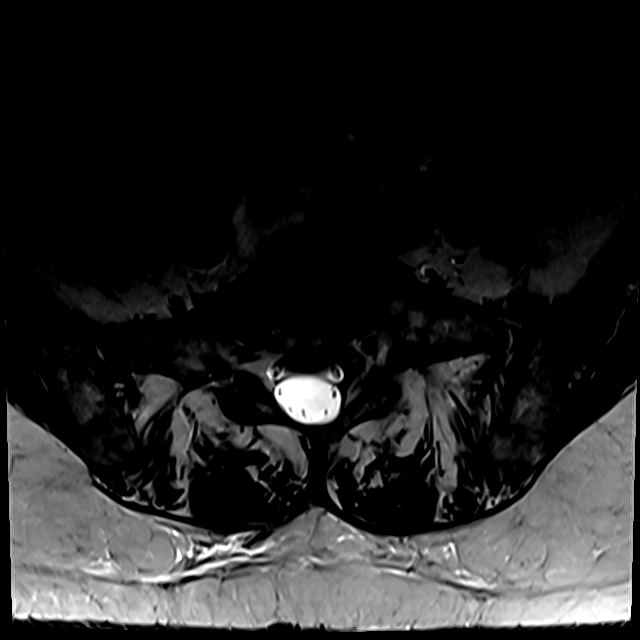
[im 5/41]
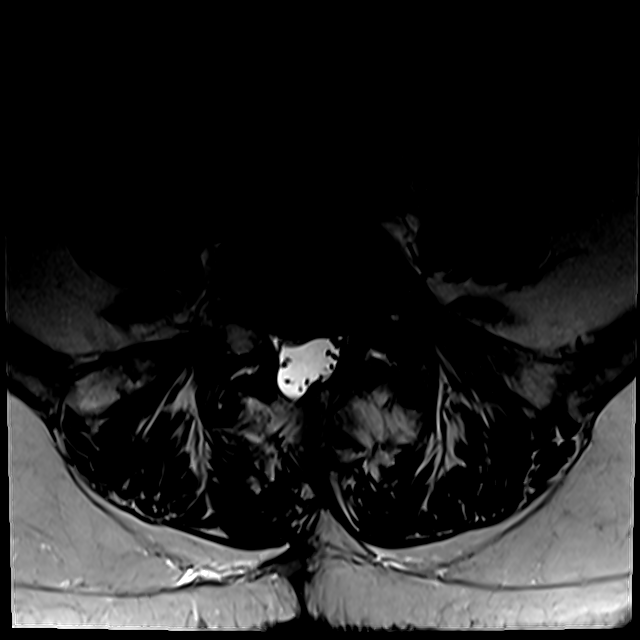
[im 14/41]
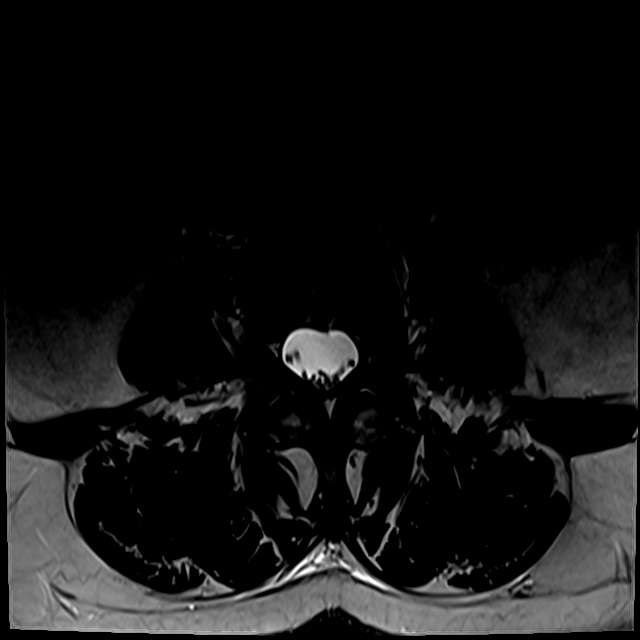
[im 18/41]
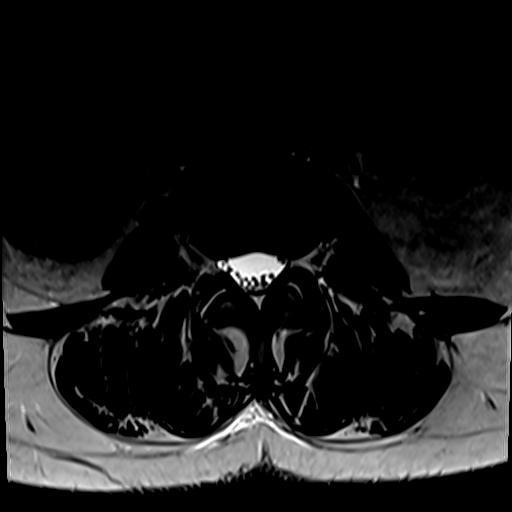
[im 23/41]
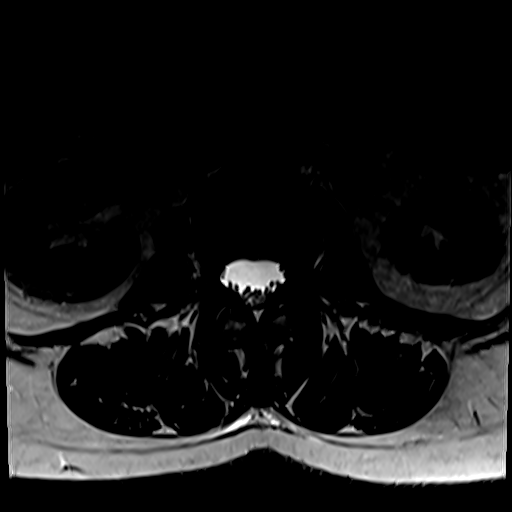
[im 27/41]
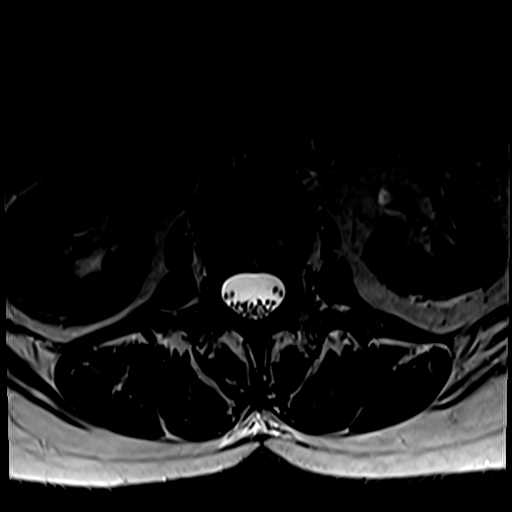
[im 36/41]
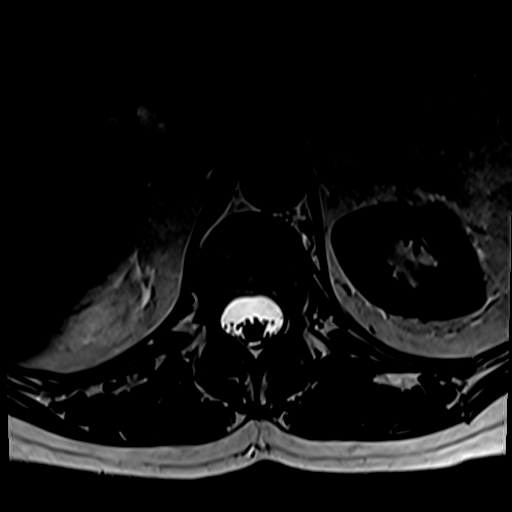
[im 41/41]
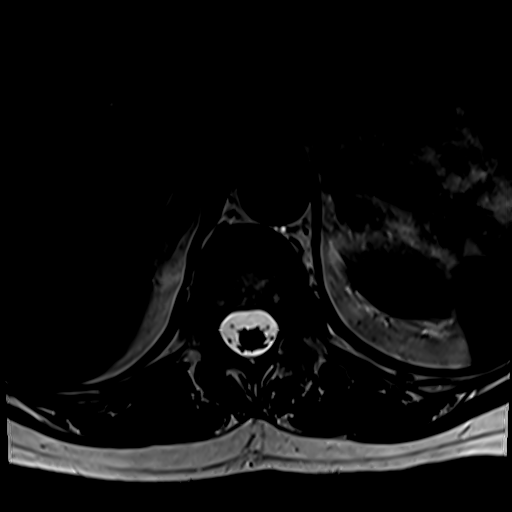

[Series 13: T2 · sagittal · 4.0mm · 0.73mm/px · 4 of 17 slices shown (2 of 2)]
[im 1/17]
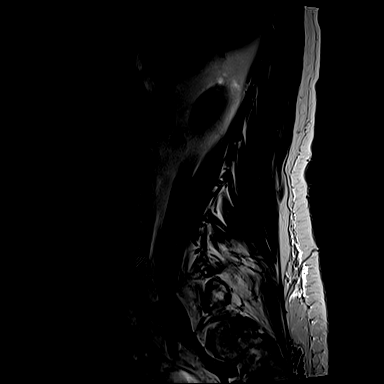
[im 6/17]
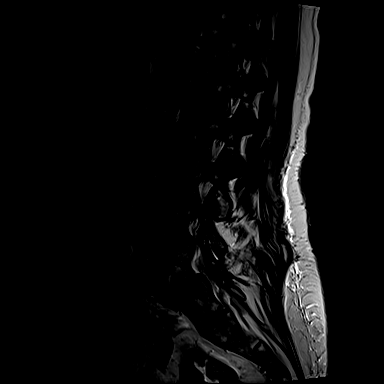
[im 11/17]
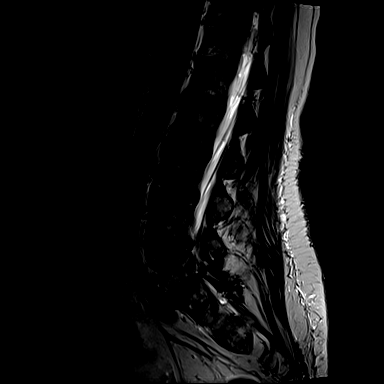
[im 17/17]
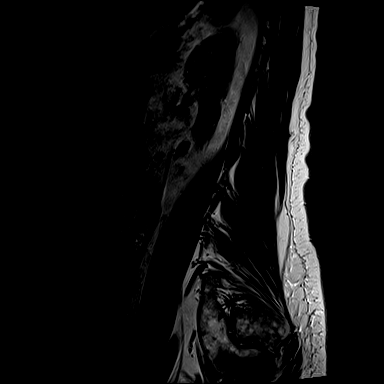

[20 of 48 positions shown; findings below may reference images not displayed]

FINDINGS: Segmentation: A transitional lumbosacral vertebra is assumed to
represent the S1 level. This numbering system is concordant with
prior MRI. Careful correlation with this numbering strategy prior to
any procedural intervention would be recommended.

Alignment:  Grade 1 anterolisthesis at L5-S1.

Vertebrae: No fracture, evidence of discitis, or bone lesion.
Endplate degenerative changes at L5-S1. Postsurgical changes from
L5-S1 laminectomy.

Conus medullaris and cauda equina: Conus extends to the L1-2 level.
Conus and cauda equina appear normal.

Paraspinal and other soft tissues: Negative.

Disc levels:

T12-L1:No spinal canal or neural foraminal stenosis.

L1-2:No spinal canal or neural foraminal stenosis.

L2-3:No spinal canal or neural foraminal stenosis.

L3-4:No spinal canal or neural foraminal stenosis.

L4-5:Shallow disc bulge, mild facet degenerative changes with
ligamentum flavum redundancy.No significant spinal canal or neural
foraminal stenosis.

L5-S1:Postsurgical changes from right laminectomy and micro
discectomy with interval resolution of the right subarticular
extruded disc component. T1 hypointense, enhancing tissue in is now
seen in right central/subarticular zone, consistent with
postsurgical granulation tissue.No spinal canal or neural foraminal
stenosis. Disc uncovering and prominent hypertrophic facet
degenerative changes with and prominent left-sided ligamentum flavum
redundancy resulting in mild left subarticular zone stenosis and
moderate left neural foraminal narrowing, progressed since prior
MRI.
IMPRESSION: 1. Postsurgical changes at L5-S1 with interval resolution of right
subarticular disc extrusion, noting enhancement in this location,
likely representing postsurgical granulation tissue.
2. Moderate left neural foraminal narrowing at L5-S1, progressed
from prior MRI, potentially impinging on the exiting left L5 nerve
root.

## 2023-06-17 DIAGNOSIS — M180 Bilateral primary osteoarthritis of first carpometacarpal joints: Secondary | ICD-10-CM | POA: Diagnosis not present

## 2023-06-18 ENCOUNTER — Other Ambulatory Visit: Payer: Self-pay | Admitting: Orthopedic Surgery

## 2023-06-24 ENCOUNTER — Encounter (HOSPITAL_BASED_OUTPATIENT_CLINIC_OR_DEPARTMENT_OTHER): Payer: Self-pay | Admitting: Orthopedic Surgery

## 2023-06-24 ENCOUNTER — Other Ambulatory Visit: Payer: Self-pay

## 2023-06-26 ENCOUNTER — Encounter (HOSPITAL_BASED_OUTPATIENT_CLINIC_OR_DEPARTMENT_OTHER)
Admission: RE | Admit: 2023-06-26 | Discharge: 2023-06-26 | Disposition: A | Discharge status: Home / Self Care | Payer: Medicare Other | Source: Ambulatory Visit | Attending: Orthopedic Surgery | Admitting: Orthopedic Surgery

## 2023-06-26 DIAGNOSIS — I10 Essential (primary) hypertension: Secondary | ICD-10-CM | POA: Diagnosis not present

## 2023-06-26 DIAGNOSIS — Z01812 Encounter for preprocedural laboratory examination: Secondary | ICD-10-CM | POA: Diagnosis not present

## 2023-06-26 LAB — BASIC METABOLIC PANEL
Anion gap: 16 — ABNORMAL HIGH (ref 5–15)
BUN: 16 mg/dL (ref 8–23)
CO2: 22 mmol/L (ref 22–32)
Calcium: 9.7 mg/dL (ref 8.9–10.3)
Chloride: 95 mmol/L — ABNORMAL LOW (ref 98–111)
Creatinine, Ser: 0.9 mg/dL (ref 0.44–1.00)
GFR, Estimated: >60 mL/min (ref >60)
Glucose, Bld: 98 mg/dL (ref 70–99)
Potassium: 4.8 mmol/L (ref 3.5–5.1)
Sodium: 133 mmol/L — ABNORMAL LOW (ref 135–145)

## 2023-06-26 NOTE — Progress Notes (Signed)

## 2023-07-01 NOTE — Anesthesia Preprocedure Evaluation (Signed)
Anesthesia Evaluation  Patient identified by MRN, date of birth, ID band Patient awake    Reviewed: Allergy & Precautions, NPO status , Patient's Chart, lab work & pertinent test results  Airway Mallampati: II  TM Distance: >3 FB Neck ROM: Full    Dental no notable dental hx. (+) Teeth Intact, Dental Advisory Given   Pulmonary former smoker   Pulmonary exam normal breath sounds clear to auscultation       Cardiovascular hypertension, Pt. on medications Normal cardiovascular exam Rhythm:Regular Rate:Normal     Neuro/Psych    GI/Hepatic ,GERD  Medicated and Controlled,,  Endo/Other    Renal/GU      Musculoskeletal  (+) Arthritis ,    Abdominal   Peds  Hematology   Anesthesia Other Findings All: Hydrochlorathiazide, Lyrica  Reproductive/Obstetrics                             Anesthesia Physical Anesthesia Plan  ASA: 2  Anesthesia Plan: Regional and MAC   Post-op Pain Management: Tylenol PO (pre-op)*   Induction:   PONV Risk Score and Plan: Propofol infusion, Midazolam and Treatment may vary due to age or medical condition  Airway Management Planned: Natural Airway and Nasal Cannula  Additional Equipment: None  Intra-op Plan:   Post-operative Plan:   Informed Consent: I have reviewed the patients History and Physical, chart, labs and discussed the procedure including the risks, benefits and alternatives for the proposed anesthesia with the patient or authorized representative who has indicated his/her understanding and acceptance.     Dental advisory given  Plan Discussed with: CRNA  Anesthesia Plan Comments: Elaine Harper Supraclav)        Anesthesia Quick Evaluation

## 2023-07-02 ENCOUNTER — Ambulatory Visit (HOSPITAL_BASED_OUTPATIENT_CLINIC_OR_DEPARTMENT_OTHER)
Admission: RE | Admit: 2023-07-02 | Discharge: 2023-07-02 | Disposition: A | Discharge status: Home / Self Care | Payer: Medicare Other | Source: Ambulatory Visit | Attending: Orthopedic Surgery | Admitting: Orthopedic Surgery

## 2023-07-02 ENCOUNTER — Encounter (HOSPITAL_BASED_OUTPATIENT_CLINIC_OR_DEPARTMENT_OTHER)
Admission: RE | Disposition: A | Discharge status: Home / Self Care | Payer: Self-pay | Source: Ambulatory Visit | Attending: Orthopedic Surgery

## 2023-07-02 ENCOUNTER — Ambulatory Visit (HOSPITAL_BASED_OUTPATIENT_CLINIC_OR_DEPARTMENT_OTHER): Payer: Medicare Other

## 2023-07-02 ENCOUNTER — Ambulatory Visit (HOSPITAL_BASED_OUTPATIENT_CLINIC_OR_DEPARTMENT_OTHER): Payer: Medicare Other | Admitting: Anesthesiology

## 2023-07-02 ENCOUNTER — Other Ambulatory Visit: Payer: Self-pay

## 2023-07-02 ENCOUNTER — Encounter (HOSPITAL_BASED_OUTPATIENT_CLINIC_OR_DEPARTMENT_OTHER): Payer: Self-pay | Admitting: Orthopedic Surgery

## 2023-07-02 DIAGNOSIS — K219 Gastro-esophageal reflux disease without esophagitis: Secondary | ICD-10-CM | POA: Insufficient documentation

## 2023-07-02 DIAGNOSIS — Z79899 Other long term (current) drug therapy: Secondary | ICD-10-CM | POA: Insufficient documentation

## 2023-07-02 DIAGNOSIS — M189 Osteoarthritis of first carpometacarpal joint, unspecified: Secondary | ICD-10-CM | POA: Diagnosis not present

## 2023-07-02 DIAGNOSIS — Z87891 Personal history of nicotine dependence: Secondary | ICD-10-CM | POA: Insufficient documentation

## 2023-07-02 DIAGNOSIS — Z8249 Family history of ischemic heart disease and other diseases of the circulatory system: Secondary | ICD-10-CM | POA: Diagnosis not present

## 2023-07-02 DIAGNOSIS — I10 Essential (primary) hypertension: Secondary | ICD-10-CM | POA: Diagnosis not present

## 2023-07-02 DIAGNOSIS — M1811 Unilateral primary osteoarthritis of first carpometacarpal joint, right hand: Secondary | ICD-10-CM | POA: Diagnosis not present

## 2023-07-02 HISTORY — PX: TENDON TRANSFER: SHX6109

## 2023-07-02 HISTORY — PX: CARPOMETACARPEL SUSPENSION PLASTY: SHX5005

## 2023-07-02 HISTORY — DX: Unilateral primary osteoarthritis of first carpometacarpal joint, right hand: M18.11

## 2023-07-02 SURGERY — CARPOMETACARPEL (CMC) SUSPENSION PLASTY
Anesthesia: Monitor Anesthesia Care | Site: Thumb | Laterality: Right

## 2023-07-02 MED ORDER — ACETAMINOPHEN 500 MG PO TABS
ORAL_TABLET | ORAL | Status: AC
  Filled 2023-07-02: qty 2

## 2023-07-02 MED ORDER — ACETAMINOPHEN 10 MG/ML IV SOLN: 1000.0000 mg | Freq: Once | INTRAVENOUS | Status: DC | PRN

## 2023-07-02 MED ORDER — CEFAZOLIN SODIUM-DEXTROSE 2-4 GM/100ML-% IV SOLN
2.0000 g | INTRAVENOUS | Status: AC
  Administered 2023-07-02: 2 g via INTRAVENOUS

## 2023-07-02 MED ORDER — LACTATED RINGERS IV SOLN: INTRAVENOUS | Status: DC

## 2023-07-02 MED ORDER — ACETAMINOPHEN 500 MG PO TABS
1000.0000 mg | ORAL_TABLET | Freq: Once | ORAL | Status: AC
  Administered 2023-07-02: 1000 mg via ORAL

## 2023-07-02 MED ORDER — PROPOFOL 10 MG/ML IV BOLUS
INTRAVENOUS | Status: DC | PRN
Start: 2023-07-02 — End: 2023-07-02
  Administered 2023-07-02 (×2): 50 mg via INTRAVENOUS

## 2023-07-02 MED ORDER — MIDAZOLAM HCL 2 MG/2ML IJ SOLN
INTRAMUSCULAR | Status: AC
  Filled 2023-07-02: qty 2

## 2023-07-02 MED ORDER — FENTANYL CITRATE (PF) 100 MCG/2ML IJ SOLN: 25.0000 ug | INTRAMUSCULAR | Status: DC | PRN

## 2023-07-02 MED ORDER — LIDOCAINE-EPINEPHRINE (PF) 1.5 %-1:200000 IJ SOLN
INTRAMUSCULAR | Status: DC | PRN
Start: 2023-07-02 — End: 2023-07-02
  Administered 2023-07-02: 10 mL via PERINEURAL

## 2023-07-02 MED ORDER — HYDROCODONE-ACETAMINOPHEN 5-325 MG PO TABS: ORAL_TABLET | ORAL | 0 refills | Status: DC

## 2023-07-02 MED ORDER — BUPIVACAINE HCL (PF) 0.5 % IJ SOLN
INTRAMUSCULAR | Status: DC | PRN
  Administered 2023-07-02: 10 mL via PERINEURAL

## 2023-07-02 MED ORDER — FENTANYL CITRATE (PF) 100 MCG/2ML IJ SOLN
INTRAMUSCULAR | Status: AC
  Filled 2023-07-02: qty 2

## 2023-07-02 MED ORDER — ONDANSETRON HCL 4 MG/2ML IJ SOLN: 4.0000 mg | Freq: Once | INTRAMUSCULAR | Status: DC | PRN

## 2023-07-02 MED ORDER — FENTANYL CITRATE (PF) 100 MCG/2ML IJ SOLN
50.0000 ug | Freq: Once | INTRAMUSCULAR | Status: AC
  Administered 2023-07-02: 50 ug via INTRAVENOUS

## 2023-07-02 MED ORDER — CEFAZOLIN SODIUM-DEXTROSE 2-4 GM/100ML-% IV SOLN
INTRAVENOUS | Status: AC
  Filled 2023-07-02: qty 100

## 2023-07-02 MED ORDER — PROPOFOL 500 MG/50ML IV EMUL
INTRAVENOUS | Status: DC | PRN
  Administered 2023-07-02: 100 ug/kg/min via INTRAVENOUS

## 2023-07-02 MED ORDER — MIDAZOLAM HCL 2 MG/2ML IJ SOLN
1.0000 mg | Freq: Once | INTRAMUSCULAR | Status: AC
  Administered 2023-07-02: 1 mg via INTRAVENOUS

## 2023-07-02 SURGICAL SUPPLY — 84 items
APL PRP STRL LF DISP 70% ISPRP (MISCELLANEOUS) ×1
BAG DECANTER FOR FLEXI CONT (MISCELLANEOUS) IMPLANT
BALL CTTN LRG ABS STRL LF (GAUZE/BANDAGES/DRESSINGS)
BLADE MINI RND TIP GREEN BEAV (BLADE) ×2 IMPLANT
BLADE SURG 15 STRL LF DISP TIS (BLADE) ×4 IMPLANT
BLADE SURG 15 STRL SS (BLADE) ×2
BNDG CMPR 5X2 KNTD ELC UNQ LF (GAUZE/BANDAGES/DRESSINGS)
BNDG CMPR 5X3 KNIT ELC UNQ LF (GAUZE/BANDAGES/DRESSINGS) ×1
BNDG CMPR 9X4 STRL LF SNTH (GAUZE/BANDAGES/DRESSINGS) ×1
BNDG ELASTIC 2INX 5YD STR LF (GAUZE/BANDAGES/DRESSINGS) IMPLANT
BNDG ELASTIC 3INX 5YD STR LF (GAUZE/BANDAGES/DRESSINGS) ×2 IMPLANT
BNDG ESMARK 4X9 LF (GAUZE/BANDAGES/DRESSINGS) ×2 IMPLANT
BNDG GAUZE DERMACEA FLUFF 4 (GAUZE/BANDAGES/DRESSINGS) ×2 IMPLANT
BNDG GZE DERMACEA 4 6PLY (GAUZE/BANDAGES/DRESSINGS) ×1
CHLORAPREP W/TINT 26 (MISCELLANEOUS) ×2 IMPLANT
CORD BIPOLAR FORCEPS 12FT (ELECTRODE) ×2 IMPLANT
COTTONBALL LRG STERILE PKG (GAUZE/BANDAGES/DRESSINGS) IMPLANT
COVER BACK TABLE 60X90IN (DRAPES) ×2 IMPLANT
COVER MAYO STAND STRL (DRAPES) ×2 IMPLANT
CUFF TOURN SGL QUICK 18X4 (TOURNIQUET CUFF) ×2 IMPLANT
DRAPE EXTREMITY T 121X128X90 (DISPOSABLE) ×2 IMPLANT
DRAPE OEC MINIVIEW 54X84 (DRAPES) ×2 IMPLANT
DRAPE SURG 17X23 STRL (DRAPES) ×2 IMPLANT
GAUZE 4X4 16PLY ~~LOC~~+RFID DBL (SPONGE) IMPLANT
GAUZE PAD ABD 8X10 STRL (GAUZE/BANDAGES/DRESSINGS) IMPLANT
GAUZE SPONGE 4X4 12PLY STRL (GAUZE/BANDAGES/DRESSINGS) ×2 IMPLANT
GAUZE XEROFORM 1X8 LF (GAUZE/BANDAGES/DRESSINGS) ×2 IMPLANT
GLOVE BIO SURGEON STRL SZ7.5 (GLOVE) ×2 IMPLANT
GLOVE BIOGEL PI IND STRL 8 (GLOVE) ×2 IMPLANT
GLOVE BIOGEL PI IND STRL 8.5 (GLOVE) IMPLANT
GLOVE SURG ORTHO 8.0 STRL STRW (GLOVE) IMPLANT
GOWN STRL REUS W/ TWL LRG LVL3 (GOWN DISPOSABLE) ×2 IMPLANT
GOWN STRL REUS W/TWL LRG LVL3 (GOWN DISPOSABLE) ×1
GOWN STRL REUS W/TWL XL LVL3 (GOWN DISPOSABLE) ×2 IMPLANT
KIT BUTTON SUT MICROLINK LP (Anchor) IMPLANT
LOOP VASCLR MAXI BLUE 18IN ST (MISCELLANEOUS) IMPLANT
LOOP VASCULAR MAXI 18 BLUE (MISCELLANEOUS)
NDL HYPO 25X1 1.5 SAFETY (NEEDLE) IMPLANT
NDL KEITH (NEEDLE) IMPLANT
NDL SAFETY ECLIP 18X1.5 (MISCELLANEOUS) IMPLANT
NEEDLE HYPO 25X1 1.5 SAFETY (NEEDLE)
NEEDLE KEITH (NEEDLE)
NS IRRIG 1000ML POUR BTL (IV SOLUTION) ×2 IMPLANT
PACK BASIN DAY SURGERY FS (CUSTOM PROCEDURE TRAY) ×2 IMPLANT
PAD CAST 3X4 CTTN HI CHSV (CAST SUPPLIES) ×2 IMPLANT
PAD CAST 4YDX4 CTTN HI CHSV (CAST SUPPLIES) IMPLANT
PADDING CAST ABS COTTON 3X4 (CAST SUPPLIES) IMPLANT
PADDING CAST ABS COTTON 4X4 ST (CAST SUPPLIES) ×2 IMPLANT
PADDING CAST COTTON 3X4 STRL (CAST SUPPLIES) ×1
PADDING CAST COTTON 4X4 STRL (CAST SUPPLIES)
SLEEVE SCD COMPRESS KNEE MED (STOCKING) IMPLANT
SPIKE FLUID TRANSFER (MISCELLANEOUS) IMPLANT
SPLINT PLASTER CAST FAST 5X30 (CAST SUPPLIES) IMPLANT
SPLINT PLASTER CAST XFAST 3X15 (CAST SUPPLIES) IMPLANT
SPLINT PLASTER CAST XFAST 4X15 (CAST SUPPLIES) IMPLANT
STOCKINETTE 4X48 STRL (DRAPES) ×2 IMPLANT
SUT CHROMIC 5 0 P 3 (SUTURE) IMPLANT
SUT ETHIBOND 3-0 V-5 (SUTURE) IMPLANT
SUT ETHILON 3 0 PS 1 (SUTURE) IMPLANT
SUT ETHILON 4 0 PS 2 18 (SUTURE) ×2 IMPLANT
SUT FIBERWIRE 2-0 18 17.9 3/8 (SUTURE) ×1
SUT FIBERWIRE 3-0 18 TAPR NDL (SUTURE)
SUT FIBERWIRE 4-0 18 DIAM BLUE (SUTURE)
SUT MERSILENE 2.0 SH NDLE (SUTURE) IMPLANT
SUT MERSILENE 4 0 P 3 (SUTURE) IMPLANT
SUT PROLENE 2 0 SH DA (SUTURE) IMPLANT
SUT PROLENE 6 0 P 1 18 (SUTURE) IMPLANT
SUT SILK 2 0 PERMA HAND 18 BK (SUTURE) IMPLANT
SUT SILK 4 0 PS 2 (SUTURE) IMPLANT
SUT VIC AB 3-0 PS1 18 (SUTURE)
SUT VIC AB 3-0 PS1 18XBRD (SUTURE) IMPLANT
SUT VIC AB 4-0 P-3 18XBRD (SUTURE) IMPLANT
SUT VIC AB 4-0 P3 18 (SUTURE)
SUT VIC AB 4-0 PS2 18 (SUTURE) ×2 IMPLANT
SUT VICRYL 0 SH 27 (SUTURE) IMPLANT
SUTURE FIBERWR 2-0 18 17.9 3/8 (SUTURE) ×2 IMPLANT
SUTURE FIBERWR 3-0 18 TAPR NDL (SUTURE) IMPLANT
SUTURE FIBERWR 4-0 18 DIA BLUE (SUTURE) IMPLANT
SYR BULB EAR ULCER 3OZ GRN STR (SYRINGE) ×2 IMPLANT
SYR CONTROL 10ML LL (SYRINGE) IMPLANT
TOWEL GREEN STERILE FF (TOWEL DISPOSABLE) ×4 IMPLANT
TUBE NG 5FR 35IN ENFIT (TUBING) IMPLANT
UNDERPAD 30X36 HEAVY ABSORB (UNDERPADS AND DIAPERS) ×2 IMPLANT
VASCULAR TIE MAXI BLUE 18IN ST (MISCELLANEOUS)

## 2023-07-02 NOTE — Anesthesia Procedure Notes (Signed)
Anesthesia Regional Block: Supraclavicular block   Pre-Anesthetic Checklist: , timeout performed,  Correct Patient, Correct Site, Correct Laterality,  Correct Procedure, Correct Position, site marked,  Risks and benefits discussed,  Surgical consent,  Pre-op evaluation,  At surgeon's request and post-op pain management  Laterality: Upper and Right  Prep: chloraprep       Needles:  Injection technique: Single-shot  Needle Type: Echogenic Needle     Needle Length: 5cm  Needle Gauge: 21     Additional Needles:   Procedures:,,,, ultrasound used (permanent image in chart),,     Nerve Stimulator or Paresthesia:   Additional Responses:  Block tested.  Patient tolerated procedure well Narrative:  Start time: 07/02/2023 11:18 AM End time: 07/02/2023 11:25 AM Injection made incrementally with aspirations every 5 mL.  Performed by: Personally  Anesthesiologist: Trevor Iha, MD  Additional Notes: Block tested. Patient tolerated procedure well.

## 2023-07-02 NOTE — H&P (Signed)
Elaine Harper is an 67 y.o. female.   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  She has tried non operative measures without lasting relief.  She wishes to proceed with trapeziectomy and suspensionplasty.  Allergies:  Allergies  Allergen Reactions   Hydrochlorothiazide Other (See Comments)    Severe muscle cramps.   Lyrica [Pregabalin]     Headache / acid reflux / insomnia / dizziness     Past Medical History:  Diagnosis Date   Anxiety    Arthritis of carpometacarpal (CMC) joint of right thumb    Complication of anesthesia    hard to put to sleep   GERD (gastroesophageal reflux disease)    Hypertension    Menopause     Past Surgical History:  Procedure Laterality Date   ABDOMINAL HYSTERECTOMY     ABLATION ON ENDOMETRIOSIS     FOOT SURGERY  1987   removed part the bone in foot   LAPAROSCOPIC APPENDECTOMY     MELANOMA EXCISION N/A 09/16/2018   Procedure: WIDE LOCAL EXCISION WITH ADVANCEMENT FLAP CLOSURE;  Surgeon: Almond Lint, MD;  Location: [facility];  Service: General;  Laterality: N/A;   MICRODISCECTOMY LUMBAR  2021   Dr. Wilder Glade NODE BIOPSY Right 09/16/2018   Procedure: SENTINEL NODE BIOPSY OF RIGHT LOWER LEG MELANOMA;  Surgeon: Almond Lint, MD;  Location:  SURGERY CENTER;  Service: General;  Laterality: Right;    Family History: Family History  Problem Relation Age of Onset   Hypertension Mother    Alzheimer's disease Father    Hypertension Sister    Cirrhosis Brother        alcoholism    Social History:   reports that she quit smoking about 36 years ago. Her smoking use included cigarettes. She has never used smokeless tobacco. She reports current alcohol use. She reports that she does not use drugs.  Medications: Medications Prior to Admission  Medication Sig Dispense Refill   calcium elemental as carbonate (TUMS ULTRA 1000) 400 MG chewable tablet Chew 2,000 mg by mouth  daily.     cholecalciferol (VITAMIN D) 1000 units tablet Take 1,000 Units by mouth daily.     estradiol (ESTRACE) 1 MG tablet Take 1 mg by mouth daily.     famotidine (PEPCID) 20 MG tablet Take 20 mg by mouth 2 (two) times daily.     ibuprofen (ADVIL) 200 MG tablet Take 400 mg by mouth every 6 (six) hours as needed.     lisinopril (ZESTRIL) 5 MG tablet Take 5 mg by mouth daily.     LORazepam (ATIVAN) 1 MG tablet Take 1 tablet by mouth at bedtime as needed (Patient taking differently: Take 0.5 mg by mouth daily as needed for anxiety.) 90 tablet 0   Magnesium 250 MG TABS Take 250 mg by mouth daily.     Polyethylene Glycol 400 (BLINK TEARS) 0.25 % SOLN Place 1 drop into both eyes daily as needed (dry eyes).     rosuvastatin (CRESTOR) 5 MG tablet Take 5 mg by mouth daily.     spironolactone (ALDACTONE) 50 MG tablet TAKE 1 TABLET BY MOUTH DAILY 30 tablet 2   TURMERIC PO Take 1 tablet by mouth daily.     fluorouracil (EFUDEX) 5 % cream Apply 1 Application topically as needed (precancerous spots).      No results found for this or any previous visit (from the past 48 hour(s)).  DG MINI C-ARM IMAGE  ONLY  Result Date: 07/02/2023 There is no interpretation for this exam.  This order is for images obtained during a surgical procedure.  Please See "Surgeries" Tab for more information regarding the procedure.      Blood pressure 127/70, pulse 66, temperature (!) 97.2 F (36.2 C), temperature source Oral, resp. rate 10, height 5\' 7"  (1.702 m), weight 74.1 kg, SpO2 98%.  General appearance: alert, cooperative, and appears stated age Head: Normocephalic, without obvious abnormality, atraumatic Neck: supple, symmetrical, trachea midline Extremities: Intact sensation and capillary refill all digits.  +epl/fpl/io.  No wounds.  Pulses: 2+ and symmetric Skin: Skin color, texture, turgor normal. No rashes or lesions Neurologic: Grossly normal Incision/Wound: none  Assessment/Plan Right thumb cmc  arthritis.  Non operative and operative treatment options have been discussed with the patient and patient wishes to proceed with operative treatment. Risks, benefits, and alternatives of surgery have been discussed and the patient agrees with the plan of care.   Betha Loa 07/02/2023, 12:29 PM

## 2023-07-02 NOTE — Discharge Instructions (Addendum)
[facility] Center Instructions Hand Surgery  Wound Care: Keep your hand elevated above the level of your heart.  Do not allow it to dangle by your side.  Keep the dressing dry and do not remove it unless your doctor advises you to do so.  He will usually change it at the time of your post-op visit.  Moving your fingers is advised to stimulate circulation but will depend on the site of your surgery.  If you have a splint applied, your doctor will advise you regarding movement.  Activity: Do not drive or operate machinery today.  Rest today and then you may return to your normal activity and work as indicated by your physician.  Diet:  Drink liquids today or eat a light diet.  You may resume a regular diet tomorrow.    General expectations: Pain for two to three days. Fingers may become slightly swollen.  Call your doctor if any of the following occur: Severe pain not relieved by pain medication. Elevated temperature. Dressing soaked with blood. Inability to move fingers. White or bluish color to fingers.    May take Tylenol after 4pm, if needed.    Post Anesthesia Home Care Instructions  Activity: Get plenty of rest for the remainder of the day. A responsible individual must stay with you for 24 hours following the procedure.  For the next 24 hours, DO NOT: -Drive a car -Advertising copywriter -Drink alcoholic beverages -Take any medication unless instructed by your physician -Make any legal decisions or sign important papers.  Meals: Start with liquid foods such as gelatin or soup. Progress to regular foods as tolerated. Avoid greasy, spicy, heavy foods. If nausea and/or vomiting occur, drink only clear liquids until the nausea and/or vomiting subsides. Call your physician if vomiting continues.  Special Instructions/Symptoms: Your throat may feel dry or sore from the anesthesia or the breathing tube placed in your throat during surgery. If this causes discomfort, gargle with warm salt  water. The discomfort should disappear within 24 hours.  If you had a scopolamine patch placed behind your ear for the management of post- operative nausea and/or vomiting:  1. The medication in the patch is effective for 72 hours, after which it should be removed.  Wrap patch in a tissue and discard in the trash. Wash hands thoroughly with soap and water. 2. You may remove the patch earlier than 72 hours if you experience unpleasant side effects which may include dry mouth, dizziness or visual disturbances. 3. Avoid touching the patch. Wash your hands with soap and water after contact with the patch.    Regional Anesthesia Blocks  1. You may not be able to move or feel the "blocked" extremity after a regional anesthetic block. This may last may last from 3-48 hours after placement, but it will go away. The length of time depends on the medication injected and your individual response to the medication. As the nerves start to wake up, you may experience tingling as the movement and feeling returns to your extremity. If the numbness and inability to move your extremity has not gone away after 48 hours, please call your surgeon.   2. The extremity that is blocked will need to be protected until the numbness is gone and the strength has returned. Because you cannot feel it, you will need to take extra care to avoid injury. Because it may be weak, you may have difficulty moving it or using it. You may not know what position it is in  without looking at it while the block is in effect.  3. For blocks in the legs and feet, returning to weight bearing and walking needs to be done carefully. You will need to wait until the numbness is entirely gone and the strength has returned. You should be able to move your leg and foot normally before you try and bear weight or walk. You will need someone to be with you when you first try to ensure you do not fall and possibly risk injury.  4. Bruising and tenderness at  the needle site are common side effects and will resolve in a few days.  5. Persistent numbness or new problems with movement should be communicated to the surgeon or the [facility] [phone] [facility] [phone]).

## 2023-07-02 NOTE — Anesthesia Procedure Notes (Signed)
Procedure Name: MAC Date/Time: 07/02/2023 12:50 PM  Performed by: Demetrio Lapping, CRNAPre-anesthesia Checklist: Patient identified, Emergency Drugs available, Suction available, Patient being monitored and Timeout performed Patient Re-evaluated:Patient Re-evaluated prior to induction Oxygen Delivery Method: Simple face mask Placement Confirmation: positive ETCO2 Dental Injury: Teeth and Oropharynx as per pre-operative assessment

## 2023-07-02 NOTE — Op Note (Signed)
I assisted Surgeons and Role:    * Betha Loa, MD - Primary    Cindee Salt, MD - Assisting on the Procedure(s): RIGHT THUMB TRAPEZIECTOMY AND SUSPENSIONPLASTY TENDON TRANSFER on 07/02/2023.  I provided assistance on this case as follows: Set up, approach, traction radial artery radial nerve, isolation of the trapezium, removal of the trapezium, harvesting of the tendon for tendon transfer, placement of the microleak suture, transfer of the tendon, closure of the wound and application of the dressing and splints.  Electronically signed by: Cindee Salt, MD Date: 07/02/2023 Time: 1:42 PM

## 2023-07-02 NOTE — Op Note (Signed)
NAME: Elaine Harper MEDICAL RECORD NO: [id number] DATE OF BIRTH: 02/19/1956 FACILITY: [facility] LOCATION: [facility] PHYSICIAN: Tami Ribas, MD   OPERATIVE REPORT   DATE OF PROCEDURE: 07/02/23    PREOPERATIVE DIAGNOSIS: Right thumb CMC osteoarthritis   POSTOPERATIVE DIAGNOSIS: Right thumb CMC osteoarthritis   PROCEDURE: 1.  Right thumb trapeziectomy 2.  Right thumb suspensioplasty   SURGEON:  Betha Loa, M.D.   ASSISTANT: Cindee Salt, MD   ANESTHESIA:  Regional with sedation   INTRAVENOUS FLUIDS:  Per anesthesia flow sheet.   ESTIMATED BLOOD LOSS:  Minimal.   COMPLICATIONS:  None.   SPECIMENS:  none   TOURNIQUET TIME:    Total Tourniquet Time Documented: Upper Arm (Right) - 43 minutes Total: Upper Arm (Right) - 43 minutes    DISPOSITION:  Stable to PACU.   INDICATIONS: 67 year old female with right thumb CMC osteoarthritis.  She has tried nonoperative measures without lasting relief.  She wishes to proceed with trapeziectomy and suspensioplasty.  Risks, benefits and alternatives of surgery were discussed including the risks of blood loss, infection, damage to nerves, vessels, tendons, ligaments, bone for surgery, need for additional surgery, complications with wound healing, continued pain, stiffness.  She voiced understanding of these risks and elected to proceed.  OPERATIVE COURSE:  After being identified preoperatively by myself,  the patient and I agreed on the procedure and site of the procedure.  The surgical site was marked.  Surgical consent had been signed. Preoperative IV antibiotic prophylaxis was given. She was transferred to the operating room and placed on the operating table in supine position with the right upper extremity on an arm board.  Sedation was induced by the anesthesiologist. A regional block had been performed by anesthesia in preoperative holding.    Right upper extremity was prepped and draped in normal sterile orthopedic  fashion.  A surgical pause was performed between the surgeons, anesthesia, and operating room staff and all were in agreement as to the patient, procedure, and site of procedure.  Tourniquet at the proximal aspect of the extremity was inflated to 250 mmHg after exsanguination of the arm with an Esmarch bandage.  Incision was made over the [facility] joint at the dorsum of the thumb.  This was carried in subcutaneous tissues by spreading technique.  Bipolar electrocautery was used to obtain hemostasis.  The interval between the APL and EPB tendons was made.  The deep branch of the radial artery was identified and protected throughout the case.  A branch of superficial branch of radial nerve was identified and protected throughout the case.  The capsule was sharply incised and elevated from the trapezium.  The trapezium was cleared of soft tissue attachments and ligaments were released using the freer elevator and knife blade.  The trapezium was removed using the rongeurs.  The FCR tendon was identified.  Was placed under traction and one half of the tendon harvested creating a distally based tendon graft.  The microleak suture anchor was used.  The guidepin was placed across the base of the thumb metacarpal into through the index finger metacarpal.  The C-arm was used in AP lateral and oblique projections to confirm appropriate positioning of the guidepin.  The microleak suture anchor was then passed using a guidepin after make an incision on the dorsum of the hand.  This was tied over the dorsum of the index finger metacarpal while keeping a hemostat between the thumb and index metacarpals to prevent overtightening.  The dorsal wound  was closed with 4-0 nylon suture.  The FCR tendon graft was then passed through the APL tendon and back onto itself.  This was secured with Ethibond suture.  C-arm was used in AP lateral and oblique projections to ensure appropriate position of the thumb metacarpal and good suspension which  was the case.  The wound was copiously irrigated with sterile saline.  Inverted interrupted 4-0 Vicryl sutures were placed in subcutaneous tissues and skin was closed with 4-0 nylon in a horizontal mattress fashion.  The wound was dressed with sterile Xeroform 4 x 4's and wrapped with a Kerlix bandage.  Thumb spica splint was placed and wrapped with Kerlix and Ace bandage.  The tourniquet was deflated at 43 minutes.  Fingertips were pink with brisk capillary refill after deflation of tourniquet.  The operative  drapes were broken down.  The patient was awoken from anesthesia safely.  She was transferred back to the stretcher and taken to PACU in stable condition.  I will see her back in the office in 1 week for postoperative followup.  I will give her a prescription for Norco 5/325 1-2 tabs PO q6 hours prn pain, dispense # 25.   Betha Loa, MD Electronically signed, 07/02/23

## 2023-07-02 NOTE — Transfer of Care (Signed)
Immediate Anesthesia Transfer of Care Note  Patient: Elaine Harper  Procedure(s) Performed: RIGHT THUMB TRAPEZIECTOMY AND SUSPENSIONPLASTY (Right: Thumb) TENDON TRANSFER (Right: Thumb)  Patient Location: PACU  Anesthesia Type:MAC combined with regional for post-op pain  Level of Consciousness: drowsy  Airway & Oxygen Therapy: Patient Spontanous Breathing and Patient connected to face mask oxygen  Post-op Assessment: Report given to RN and Post -op Vital signs reviewed and stable  Post vital signs: Reviewed and stable  Last Vitals:  Vitals Value Taken Time  BP 116/62 (76)   Temp 36.2 C 07/02/23 1345  Pulse 74 07/02/23 1346  Resp 20 07/02/23 1346  SpO2 95 % 07/02/23 1346  Vitals shown include unfiled device data.  Last Pain:  Vitals:   07/02/23 1045  TempSrc: Oral  PainSc: 0-No pain         Complications: No notable events documented.

## 2023-07-02 NOTE — Anesthesia Postprocedure Evaluation (Signed)
Anesthesia Post Note  Patient: Elaine Harper  Procedure(s) Performed: RIGHT THUMB TRAPEZIECTOMY AND SUSPENSIONPLASTY (Right: Thumb) TENDON TRANSFER (Right: Thumb)     Patient location during evaluation: PACU Anesthesia Type: Regional and MAC Level of consciousness: awake and alert Pain management: pain level controlled Vital Signs Assessment: post-procedure vital signs reviewed and stable Respiratory status: spontaneous breathing, nonlabored ventilation and respiratory function stable Cardiovascular status: blood pressure returned to baseline Postop Assessment: no apparent nausea or vomiting Anesthetic complications: no   No notable events documented.  Last Vitals:  Vitals:   07/02/23 1347 07/02/23 1400  BP:  121/67  Pulse:  65  Resp:  20  Temp:    SpO2: 96% 94%    Last Pain:  Vitals:   07/02/23 1400  TempSrc:   PainSc: 0-No pain                 Shanda Howells

## 2023-07-02 NOTE — Progress Notes (Signed)
Assisted Dr. Valma Cava with right, supraclavicular, ultrasound guided block. Side rails up, monitors on throughout procedure. See vital signs in flow sheet. Tolerated Procedure well.

## 2023-07-08 ENCOUNTER — Encounter (HOSPITAL_BASED_OUTPATIENT_CLINIC_OR_DEPARTMENT_OTHER): Payer: Self-pay | Admitting: Orthopedic Surgery

## 2023-07-08 DIAGNOSIS — M180 Bilateral primary osteoarthritis of first carpometacarpal joints: Secondary | ICD-10-CM | POA: Diagnosis not present

## 2023-07-08 DIAGNOSIS — M79644 Pain in right finger(s): Secondary | ICD-10-CM | POA: Diagnosis not present

## 2023-07-08 DIAGNOSIS — M25641 Stiffness of right hand, not elsewhere classified: Secondary | ICD-10-CM | POA: Diagnosis not present

## 2023-07-10 DIAGNOSIS — H5213 Myopia, bilateral: Secondary | ICD-10-CM | POA: Diagnosis not present

## 2023-07-10 DIAGNOSIS — H2513 Age-related nuclear cataract, bilateral: Secondary | ICD-10-CM | POA: Diagnosis not present

## 2023-07-10 DIAGNOSIS — H43812 Vitreous degeneration, left eye: Secondary | ICD-10-CM | POA: Diagnosis not present

## 2023-08-11 DIAGNOSIS — M25641 Stiffness of right hand, not elsewhere classified: Secondary | ICD-10-CM | POA: Diagnosis not present

## 2023-08-11 DIAGNOSIS — M79644 Pain in right finger(s): Secondary | ICD-10-CM | POA: Diagnosis not present

## 2023-09-02 DIAGNOSIS — M79644 Pain in right finger(s): Secondary | ICD-10-CM | POA: Diagnosis not present

## 2023-09-02 DIAGNOSIS — M25641 Stiffness of right hand, not elsewhere classified: Secondary | ICD-10-CM | POA: Diagnosis not present

## 2023-09-07 DIAGNOSIS — L718 Other rosacea: Secondary | ICD-10-CM | POA: Diagnosis not present

## 2023-09-29 DIAGNOSIS — M180 Bilateral primary osteoarthritis of first carpometacarpal joints: Secondary | ICD-10-CM | POA: Diagnosis not present

## 2023-10-16 DIAGNOSIS — M25511 Pain in right shoulder: Secondary | ICD-10-CM | POA: Diagnosis not present

## 2023-10-16 DIAGNOSIS — I10 Essential (primary) hypertension: Secondary | ICD-10-CM | POA: Diagnosis not present

## 2023-10-29 NOTE — Progress Notes (Signed)
 LILLETTE Ileana Collet, PhD, LAT, ATC acting as a scribe for Elaine Lloyd, MD.  Elaine Harper is a 68 y.o. female who presents to [facility] today for R shoulder pain x 4-5 months. Pain is disturbing her sleep. She had hand surgery in Sept and was in a sling. She is LHD. Pt locates pain to all over her R shoulder joint.   Radiates: no Aggravates: IR, overhead, R-side lying Treatments tried: meloxicam, IBU  Pertinent review of systems: No fevers or chills  Relevant historical information: In September she had the trapeziectomy by Dr. Murrell right wrist. Left hand dominant   Exam:  BP 128/84   Pulse 62   Ht 5' 7 (1.702 m)   Wt 159 lb (72.1 kg)   SpO2 91%   BMI 24.90 kg/m  General: Well Developed, well nourished, and in no acute distress.   MSK: Right shoulder normal-appearing normal motion pain with abduction. Strength intact pain with resisted abduction. Positive Hawkins and Neer's test.  Positive empty can test. Pulses cap refill and sensation are intact distally.    Lab and Radiology Results  Procedure: Real-time Ultrasound Guided Injection of right shoulder subacromial bursa Device: Philips Affiniti 50G/GE Logiq Images permanently stored and available for review in PACS Ultrasound evaluation prior to injection reveals intact rotator cuff tendons with calcific change at the distal infraspinatus and supraspinatus tendons typical for chronic calcific tendinopathy. Mild subacromial bursitis is also present.  No full-thickness retracted rotator cuff tears are present. Verbal informed consent obtained.  Discussed risks and benefits of procedure. Warned about infection, bleeding, hyperglycemia damage to structures among others. Patient expresses understanding and agreement Time-out conducted.   Noted no overlying erythema, induration, or other signs of local infection.   Skin prepped in a sterile fashion.   Local anesthesia: Topical Ethyl  chloride.   With sterile technique and under real time ultrasound guidance: 40 mg of Kenalog  and 2 mL of Marcaine  injected into subacromial bursa. Fluid seen entering the bursa.   Completed without difficulty   Pain immediately resolved suggesting accurate placement of the medication.   Advised to call if fevers/chills, erythema, induration, drainage, or persistent bleeding.   Images permanently stored and available for review in the ultrasound unit.  Impression: Technically successful ultrasound guided injection.   X-ray images right shoulder obtained today personally and independently interpreted No acute fractures.  No severe degenerative changes. Await formal radiology review     Assessment and Plan: 68 y.o. female with right shoulder pain.  This is a chronic ongoing issue over the last for 5 months.  Pain due to chronic rotator cuff tendinopathy impingement and bursitis.  Plan for subacromial injection today and x-ray.  Refer to physical therapy and recheck in about 2 months.   PDMP not reviewed this encounter. Orders Placed This Encounter  Procedures   US  LIMITED JOINT SPACE STRUCTURES UP RIGHT(NO LINKED CHARGES)    Reason for Exam (SYMPTOM  OR DIAGNOSIS REQUIRED):   right shoulder pain    Preferred imaging location?:   [facility] Shoulder Right    Standing Status:   Future    Number of Occurrences:   1    Expiration Date:   10/29/2024    Reason for Exam (SYMPTOM  OR DIAGNOSIS REQUIRED):   right shoulder pain    Preferred imaging location?:   [facility]   Ambulatory referral to Physical Therapy    Referral Priority:  Routine    Referral Type:   Physical Medicine    Referral Reason:   Specialty Services Required    Requested Specialty:   Physical Therapy    Number of Visits Requested:   1   No orders of the defined types were placed in this encounter.    Discussed warning signs or symptoms. Please see discharge instructions.  Patient expresses understanding.   The above documentation has been reviewed and is accurate and complete Elaine Harper, M.D.

## 2023-10-30 ENCOUNTER — Ambulatory Visit: Payer: Medicare Other | Admitting: Family Medicine

## 2023-10-30 ENCOUNTER — Other Ambulatory Visit: Payer: Self-pay

## 2023-10-30 ENCOUNTER — Ambulatory Visit (INDEPENDENT_AMBULATORY_CARE_PROVIDER_SITE_OTHER): Payer: Medicare Other

## 2023-10-30 VITALS — BP 128/84 | HR 62 | Ht 67.0 in | Wt 159.0 lb

## 2023-10-30 DIAGNOSIS — M25511 Pain in right shoulder: Secondary | ICD-10-CM | POA: Diagnosis not present

## 2023-10-30 DIAGNOSIS — M19011 Primary osteoarthritis, right shoulder: Secondary | ICD-10-CM | POA: Diagnosis not present

## 2023-10-30 DIAGNOSIS — G8929 Other chronic pain: Secondary | ICD-10-CM

## 2023-10-30 DIAGNOSIS — M779 Enthesopathy, unspecified: Secondary | ICD-10-CM | POA: Diagnosis not present

## 2023-10-30 NOTE — Patient Instructions (Addendum)
 Thank you for coming in today.   Please get an Xray today before you leave   You received an injection today. Seek immediate medical attention if the joint becomes red, extremely painful, or is oozing fluid.   I've referred you to Physical Therapy.  Let us  know if you don't hear from them in one week.   Check back in 2 months. OK to cancel if feeling better.

## 2023-11-02 NOTE — Progress Notes (Signed)
 Right shoulder x-ray shows mild arthritis at the small joint the top of the shoulder the [facility] joint.

## 2023-11-03 ENCOUNTER — Encounter: Payer: Self-pay | Admitting: Physical Therapy

## 2023-11-03 ENCOUNTER — Ambulatory Visit: Payer: Medicare Other | Admitting: Physical Therapy

## 2023-11-03 DIAGNOSIS — M25511 Pain in right shoulder: Secondary | ICD-10-CM | POA: Diagnosis not present

## 2023-11-03 DIAGNOSIS — M6281 Muscle weakness (generalized): Secondary | ICD-10-CM | POA: Diagnosis not present

## 2023-11-03 DIAGNOSIS — G8929 Other chronic pain: Secondary | ICD-10-CM

## 2023-11-03 NOTE — Therapy (Addendum)
 OUTPATIENT PHYSICAL THERAPY SHOULDER EVALUATION   Patient Name: Elaine Harper MRN: [id number] DOB:Mar 07, 1956, 68 y.o., female Today's Date: 11/03/2023  END OF SESSION:  PT End of Session - 11/03/23 0809     Visit Number 1    Number of Visits 20    Date for PT Re-Evaluation 01/12/24    PT Start Time 0805    PT Stop Time 0844    PT Time Calculation (min) 39 min    Activity Tolerance Patient tolerated treatment well    Behavior During Therapy [facility] for tasks assessed/performed             Past Medical History:  Diagnosis Date   Anxiety    Arthritis of carpometacarpal (CMC) joint of right thumb    Complication of anesthesia    hard to put to sleep   GERD (gastroesophageal reflux disease)    Hypertension    Menopause    Past Surgical History:  Procedure Laterality Date   ABDOMINAL HYSTERECTOMY     ABLATION ON ENDOMETRIOSIS     CARPOMETACARPEL SUSPENSION PLASTY Right 07/02/2023   Procedure: RIGHT THUMB TRAPEZIECTOMY AND SUSPENSIONPLASTY;  Surgeon: Murrell Drivers, MD;  Location: [facility];  Service: Orthopedics;  Laterality: Right;  90 MIN   FOOT SURGERY  1987   removed part the bone in foot   LAPAROSCOPIC APPENDECTOMY     MELANOMA EXCISION N/A 09/16/2018   Procedure: WIDE LOCAL EXCISION WITH ADVANCEMENT FLAP CLOSURE;  Surgeon: Aron Shoulders, MD;  Location: [facility];  Service: General;  Laterality: N/A;   MICRODISCECTOMY LUMBAR  2021   Dr. Mavis OSLER NODE BIOPSY Right 09/16/2018   Procedure: SENTINEL NODE BIOPSY OF RIGHT LOWER LEG MELANOMA;  Surgeon: Aron Shoulders, MD;  Location: [facility];  Service: General;  Laterality: Right;   TENDON TRANSFER Right 07/02/2023   Procedure: TENDON TRANSFER;  Surgeon: Murrell Drivers, MD;  Location: [facility];  Service: Orthopedics;  Laterality: Right;   Patient Active Problem List   Diagnosis Date Noted   Spondylolisthesis of lumbosacral region 01/26/2023   Posterior  vitreous detachment of left eye 04/10/2022   Nuclear sclerotic cataract of both eyes 04/10/2022    PCP: Dwight Trula SQUIBB, MD   REFERRING PROVIDER: Joane Artist RAMAN, MD   REFERRING DIAG:  Diagnosis  M25.511,G89.29 (ICD-10-CM) - Chronic right shoulder pain    THERAPY DIAG:  Muscle weakness (generalized)  Chronic right shoulder pain  Rationale for Evaluation and Treatment: Rehabilitation  ONSET DATE: 4-5 months ago  SUBJECTIVE:  SUBJECTIVE STATEMENT: Pt seen today for R shoulder pain x 4-5 months. Pain is disturbing her sleep. She had hand surgery in Sept and was in a sling. Pt locates pain to all over her R shoulder joint. Pt s/p injection on 10/30/23.  PERTINENT HISTORY: Rt shoulder injection on 10/30/23 Arthritis, GERD, HTN, menopause, anxiety, abdominal hysterectoy, carpometacarpel plasty on Rt, foot surgery ,appendectomy, lumbar microdisectomy, tendon transfer Rt,   PAIN:  NPRS scale: no pain upon arrival, worse pain  8/10 Pain location: Rt shoulder Pain description: intense ache Aggravating factors: Rt sidelying Relieving factors:   PRECAUTIONS: None  WEIGHT BEARING RESTRICTIONS: No  FALLS:  Has patient fallen in last 6 months? No  LIVING ENVIRONMENT: Lives with: lives with their family Lives in: House/apartment Stairs: Yes; 3 floors Has following equipment at home: None  OCCUPATION: retired  PLOF: Independent  PATIENT GOALS:Be able to reach upward, and improve sleep  Next MD visit:   OBJECTIVE:   DIAGNOSTIC FINDINGS:  10/30/23 Ultrasound evaluation reveals intact rotator cuff tendons with calcific change at the distal infraspinatus and supraspinatus tendons typical for chronic calcific tendinopathy. Mild subacromial bursitis is also present.  No full-thickness retracted rotator  cuff tears are present. (Per Dr. Joane):  X-ray images right shoulder obtained today personally and independently interpreted No acute fractures.  No severe degenerative changes.  PATIENT SURVEYS:  11/03/23: FOTO intake:   57% (predicted 67%)    COGNITION: Overall cognitive status: WFL     SENSATION: WFL  POSTURE: Forward shoulders and head  UPPER EXTREMITY ROM:   ROM Right 11/03/23 Left 11/03/23  Shoulder flexion 118 160  Shoulder extension 30 50  Shoulder abduction 130 165  Shoulder adduction    Shoulder internal rotation 58 c pain 65  Shoulder external rotation 60 80  Elbow flexion    Elbow extension    Wrist flexion    Wrist extension    Wrist ulnar deviation    Wrist radial deviation    Wrist pronation    Wrist supination    (Blank rows = not tested)  UPPER EXTREMITY MMT:  MMT Right 11/03/23 Left 11/03/23  Shoulder flexion 4 5  Shoulder extension 4 5  Shoulder abduction 3 5  Shoulder adduction 3 5  Shoulder internal rotation 3 5  Shoulder external rotation 4 5  Middle trapezius    Lower trapezius    Elbow flexion    Elbow extension    Wrist flexion    Wrist extension    Wrist ulnar deviation    Wrist radial deviation    Wrist pronation    Wrist supination    Grip strength (lbs)    (Blank rows = not tested)  SHOULDER SPECIAL TESTS: Impingement tests: Hawkins/Kennedy impingement test: positive  on Rt    Positive Empty Can test on Rt   PALPATION:  TTP: Rt anteior shoulder capsule, Rt levator scapulae, Rt middle deltoid  TODAY'S TREATMENT:                                                                                                       DATE: 11/03/23 TherEx: HEP instruction/performance c cues for techniques, handout provided.  Trial set performed of each for  comprehension and symptom assessment.  See below for exercise list   PATIENT EDUCATION: Education details: HEP, POC Person educated: Patient Education method: Explanation, Demonstration, Verbal cues, and Handouts Education comprehension: verbalized understanding, returned demonstration, and verbal cues required  HOME EXERCISE PROGRAM: Access Code: [email or web address] URL: [email or web address] Date: 11/03/2023 Prepared by: Delon Lunger  Exercises - Supine Shoulder External Rotation in 45 Degrees Abduction AAROM with Dowel  - 2 x daily - 7 x weekly - 2 sets - 10 reps - 3 seconds hold - Supine Shoulder Flexion Extension AAROM with Dowel  - 2 x daily - 7 x weekly - 2 sets - 10 reps - 3 seconds hold - Standing Shoulder Row with Anchored Resistance  - 2 x daily - 7 x weekly - 2 sets - 10 reps - 3 seconds hold - Doorway Pec Stretch at 60 Elevation  - 2 x daily - 7 x weekly - 3 reps - 5-10 seconds hold  ASSESSMENT:  CLINICAL IMPRESSION: Patient is a 68 y.o. who comes to clinic with complaints of Rt shoulder pain with mobility, strength and movement coordination deficits that impair their ability to perform usual daily and recreational functional activities without increase difficulty/symptoms at this time.  Patient to benefit from skilled PT services to address impairments and limitations to improve to previous level of function without restriction secondary to condition.   OBJECTIVE IMPAIRMENTS: decreased ROM, decreased strength, impaired UE functional use, postural dysfunction, and pain.   ACTIVITY LIMITATIONS: lifting, sleeping, dressing, reach over head, and hygiene/grooming  PARTICIPATION LIMITATIONS: cleaning, laundry, and community activity  PERSONAL FACTORS:  see pertinent   are also affecting patient's functional outcome.   REHAB POTENTIAL: Good  CLINICAL DECISION MAKING: Stable/uncomplicated  EVALUATION COMPLEXITY: Low   GOALS: Goals reviewed with patient?  Yes  SHORT TERM GOALS: (target date for Short term goals are 3 weeks (11/24/2023)  1.Patient will demonstrate independent use of home exercise program to maintain progress from in clinic treatments. Goal status: New  LONG TERM GOALS: (target dates for all long term goals are 10 weeks  01/12/2024 )   1. Patient will demonstrate/report pain at worst less than or equal to 2/10 to facilitate minimal limitation in daily activity secondary to pain symptoms. Goal status: New   2. Patient will demonstrate independent use of home exercise program to facilitate ability to maintain/progress functional gains from skilled physical therapy services. Goal status: New   3. Patient will demonstrate FOTO outcome > or = 67 % to indicate reduced disability due to condition. Goal status: New   4.  Patient will demonstrate Rt UE MMT >/= 4/5 throughout to facilitate lifting, reaching, carrying at [facility] in daily activity.   Goal status: New   5.  Patient will demonstrate Rt GH joint AROM WFL s  symptoms to facilitate usual overhead reaching, self care, dressing at PLOF.    Goal status: New   6.  Pt will be able to place 2# weight on overhead shelf using her Rt UE with pain </= 2/10.  Goal status: New   7.  Pt will improve her Rt shoulder abduction to >/= 150 degrees in order to improve functional mobility.  Goal Status: New  PLAN:  PT FREQUENCY: 1-2x/week 2  PT DURATION: 10 weeks  PLANNED INTERVENTIONS: Can include 02853- PT Re-evaluation, 97110-Therapeutic exercises, 97530- Therapeutic activity, [id number]- Neuromuscular re-education, 97535- Self Care, 97140- Manual therapy, [phone]- Gait training, [phone]- Orthotic Fit/training, [phone]- Canalith repositioning, [id number]- Aquatic Therapy, 97014- Electrical stimulation (unattended), [id number]- Electrical stimulation (manual), [id number]- Vasopneumatic device, [id number]- Ultrasound, [id number]- Traction (mechanical), [id number]- Ionotophoresis 4mg /ml Dexamethasone , Patient/Family education,  Balance training, Stair training, Taping, Dry Needling, Joint mobilization, Joint manipulation, Spinal manipulation, Spinal mobilization, Scar mobilization, Vestibular training, Visual/preceptual remediation/compensation, DME instructions, Cryotherapy, and Moist heat.  All performed as medically necessary.  All included unless contraindicated  PLAN FOR NEXT SESSION: Review HEP knowledge/results, shoulder ROM, strengthening to tolerance      Delon JONELLE Lunger, PT, MPT 11/03/2023, 10:34 AM  Date of referral: 10/30/23 Referring provider: Joane Artist RAMAN, MD  Referring diagnosis? M25.511,G89.29 (ICD-10-CM) - Chronic right shoulder pain Treatment diagnosis? (if different than referring diagnosis) M25.511, M62.81  What was this (referring dx) caused by? New onset of Rt shoulder pain which began about 4-5 months ago.   Nature of Condition: Chronic (continuous duration > 3 months)   Laterality: Rt  Current Functional Measure Score: FOTO 57%  Objective measurements identify impairments when they are compared to normal values, the uninvolved extremity, and prior level of function.  [x]  Yes  []  No  Objective assessment of functional ability: Minimal functional limitations   Briefly describe symptoms: pain, decreased strength and decreased ROM  How did symptoms start: unknown origin about 4-5 months ago  Average pain intensity:  Last 24 hours: 3/10  Past week: 8/10  How often does the pt experience symptoms? Frequently  How much have the symptoms interfered with usual daily activities? Moderately  How has condition changed since care began at this facility? NA - initial visit  In general, how is the patients overall health? Very Good   BACK PAIN (STarT Back Screening Tool) No

## 2023-11-11 ENCOUNTER — Encounter: Payer: Medicare Other | Admitting: Physical Therapy

## 2023-11-16 ENCOUNTER — Encounter: Payer: Medicare Other | Admitting: Physical Therapy

## 2023-11-17 ENCOUNTER — Encounter: Payer: Medicare Other | Admitting: Physical Therapy

## 2023-11-17 DIAGNOSIS — M4317 Spondylolisthesis, lumbosacral region: Secondary | ICD-10-CM | POA: Diagnosis not present

## 2023-11-19 DIAGNOSIS — D485 Neoplasm of uncertain behavior of skin: Secondary | ICD-10-CM | POA: Diagnosis not present

## 2023-11-19 DIAGNOSIS — Z85820 Personal history of malignant melanoma of skin: Secondary | ICD-10-CM | POA: Diagnosis not present

## 2023-11-19 DIAGNOSIS — L821 Other seborrheic keratosis: Secondary | ICD-10-CM | POA: Diagnosis not present

## 2023-11-19 DIAGNOSIS — Z85828 Personal history of other malignant neoplasm of skin: Secondary | ICD-10-CM | POA: Diagnosis not present

## 2023-11-19 DIAGNOSIS — L814 Other melanin hyperpigmentation: Secondary | ICD-10-CM | POA: Diagnosis not present

## 2023-11-19 DIAGNOSIS — C44319 Basal cell carcinoma of skin of other parts of face: Secondary | ICD-10-CM | POA: Diagnosis not present

## 2023-11-19 DIAGNOSIS — L718 Other rosacea: Secondary | ICD-10-CM | POA: Diagnosis not present

## 2023-11-19 DIAGNOSIS — D0462 Carcinoma in situ of skin of left upper limb, including shoulder: Secondary | ICD-10-CM | POA: Diagnosis not present

## 2023-11-19 DIAGNOSIS — D4819 Other specified neoplasm of uncertain behavior of connective and other soft tissue: Secondary | ICD-10-CM | POA: Diagnosis not present

## 2023-11-19 DIAGNOSIS — C4441 Basal cell carcinoma of skin of scalp and neck: Secondary | ICD-10-CM | POA: Diagnosis not present

## 2023-11-19 DIAGNOSIS — L570 Actinic keratosis: Secondary | ICD-10-CM | POA: Diagnosis not present

## 2023-11-23 ENCOUNTER — Encounter: Payer: Medicare Other | Admitting: Physical Therapy

## 2023-11-25 DIAGNOSIS — C44319 Basal cell carcinoma of skin of other parts of face: Secondary | ICD-10-CM | POA: Diagnosis not present

## 2023-11-30 ENCOUNTER — Encounter: Payer: Medicare Other | Admitting: Physical Therapy

## 2023-12-02 DIAGNOSIS — D485 Neoplasm of uncertain behavior of skin: Secondary | ICD-10-CM | POA: Diagnosis not present

## 2023-12-07 ENCOUNTER — Encounter: Payer: Medicare Other | Admitting: Physical Therapy

## 2023-12-20 ENCOUNTER — Emergency Department (HOSPITAL_COMMUNITY): Payer: Medicare Other

## 2023-12-20 ENCOUNTER — Emergency Department (HOSPITAL_COMMUNITY)
Admission: EM | Admit: 2023-12-20 | Discharge: 2023-12-20 | Disposition: A | Discharge status: Home / Self Care | Payer: Medicare Other | Attending: Emergency Medicine | Admitting: Emergency Medicine

## 2023-12-20 ENCOUNTER — Encounter (HOSPITAL_COMMUNITY): Payer: Self-pay

## 2023-12-20 ENCOUNTER — Other Ambulatory Visit: Payer: Self-pay

## 2023-12-20 DIAGNOSIS — R600 Localized edema: Secondary | ICD-10-CM | POA: Diagnosis not present

## 2023-12-20 DIAGNOSIS — R1011 Right upper quadrant pain: Secondary | ICD-10-CM | POA: Diagnosis not present

## 2023-12-20 DIAGNOSIS — Z79899 Other long term (current) drug therapy: Secondary | ICD-10-CM | POA: Insufficient documentation

## 2023-12-20 DIAGNOSIS — K5730 Diverticulosis of large intestine without perforation or abscess without bleeding: Secondary | ICD-10-CM | POA: Diagnosis not present

## 2023-12-20 DIAGNOSIS — R1033 Periumbilical pain: Secondary | ICD-10-CM | POA: Insufficient documentation

## 2023-12-20 DIAGNOSIS — R1013 Epigastric pain: Secondary | ICD-10-CM | POA: Diagnosis not present

## 2023-12-20 DIAGNOSIS — R109 Unspecified abdominal pain: Secondary | ICD-10-CM | POA: Diagnosis not present

## 2023-12-20 DIAGNOSIS — K838 Other specified diseases of biliary tract: Secondary | ICD-10-CM | POA: Diagnosis not present

## 2023-12-20 DIAGNOSIS — R1012 Left upper quadrant pain: Secondary | ICD-10-CM | POA: Insufficient documentation

## 2023-12-20 DIAGNOSIS — I10 Essential (primary) hypertension: Secondary | ICD-10-CM | POA: Insufficient documentation

## 2023-12-20 DIAGNOSIS — R079 Chest pain, unspecified: Secondary | ICD-10-CM | POA: Diagnosis not present

## 2023-12-20 LAB — COMPREHENSIVE METABOLIC PANEL
ALT: 48 U/L — ABNORMAL HIGH (ref 0–44)
AST: 54 U/L — ABNORMAL HIGH (ref 15–41)
Albumin: 3.6 g/dL (ref 3.5–5.0)
Alkaline Phosphatase: 67 U/L (ref 38–126)
Anion gap: 10 (ref 5–15)
BUN: 18 mg/dL (ref 8–23)
CO2: 23 mmol/L (ref 22–32)
Calcium: 9 mg/dL (ref 8.9–10.3)
Chloride: 104 mmol/L (ref 98–111)
Creatinine, Ser: 0.9 mg/dL (ref 0.44–1.00)
GFR, Estimated: >60 mL/min (ref >60)
Glucose, Bld: 113 mg/dL — ABNORMAL HIGH (ref 70–99)
Potassium: 4.4 mmol/L (ref 3.5–5.1)
Sodium: 137 mmol/L (ref 135–145)
Total Bilirubin: 0.8 mg/dL (ref 0.0–1.2)
Total Protein: 6.3 g/dL — ABNORMAL LOW (ref 6.5–8.1)

## 2023-12-20 LAB — CBC
HCT: 43.1 % (ref 36.0–46.0)
Hemoglobin: 14.7 g/dL (ref 12.0–15.0)
MCH: 30.2 pg (ref 26.0–34.0)
MCHC: 34.1 g/dL (ref 30.0–36.0)
MCV: 88.5 fL (ref 80.0–100.0)
Platelets: 345 10*3/uL (ref 150–400)
RBC: 4.87 MIL/uL (ref 3.87–5.11)
RDW: 13.1 % (ref 11.5–15.5)
WBC: 8.8 10*3/uL (ref 4.0–10.5)
nRBC: 0 % (ref 0.0–0.2)

## 2023-12-20 LAB — URINALYSIS, ROUTINE W REFLEX MICROSCOPIC
Bilirubin Urine: NEGATIVE
Glucose, UA: NEGATIVE mg/dL
Hgb urine dipstick: NEGATIVE
Ketones, ur: NEGATIVE mg/dL
Leukocytes,Ua: NEGATIVE
Nitrite: NEGATIVE
Protein, ur: NEGATIVE mg/dL
Specific Gravity, Urine: 1.017 (ref 1.005–1.030)
pH: 8 (ref 5.0–8.0)

## 2023-12-20 LAB — TROPONIN I (HIGH SENSITIVITY)
Troponin I (High Sensitivity): <2 ng/L (ref <18)
Troponin I (High Sensitivity): <2 ng/L (ref <18)

## 2023-12-20 LAB — LIPASE, BLOOD: Lipase: 38 U/L (ref 11–51)

## 2023-12-20 MED ORDER — HYDROMORPHONE HCL 1 MG/ML IJ SOLN
1.0000 mg | Freq: Once | INTRAMUSCULAR | Status: AC
  Administered 2023-12-20: 1 mg via INTRAVENOUS
  Filled 2023-12-20: qty 1

## 2023-12-20 MED ORDER — IOHEXOL 350 MG/ML SOLN
100.0000 mL | Freq: Once | INTRAVENOUS | Status: AC | PRN
  Administered 2023-12-20: 100 mL via INTRAVENOUS

## 2023-12-20 MED ORDER — ONDANSETRON HCL 4 MG/2ML IJ SOLN
4.0000 mg | Freq: Once | INTRAMUSCULAR | Status: AC
  Administered 2023-12-20: 4 mg via INTRAVENOUS
  Filled 2023-12-20: qty 2

## 2023-12-20 MED ORDER — MORPHINE SULFATE (PF) 4 MG/ML IV SOLN
4.0000 mg | Freq: Once | INTRAVENOUS | Status: AC
  Administered 2023-12-20: 4 mg via INTRAVENOUS
  Filled 2023-12-20: qty 1

## 2023-12-20 NOTE — Discharge Instructions (Signed)
 Everything with your heart, lungs and intestines look okay today but you did have some dilation of your bile duct and it is possible that you passed a kidney stone.  Your liver tests were mildly elevated.  It will be important to stick with a very bland diet the rest of today and then slowly progress back to your normal diet.  However if you continue to get discomfort I would call [facility].  If the pain comes back severely like it had today or you start having vomiting or fever return to the emergency room.  You can take Tylenol as needed for the pain.

## 2023-12-20 NOTE — ED Triage Notes (Signed)
 Pt c/o mid abdominal pain radiating into back and chest.  Denies n/v/d.  Pain score 8/10.  Denies previous cardiac or GI history.

## 2023-12-20 NOTE — ED Provider Notes (Signed)
 [place] EMERGENCY DEPARTMENT AT [facility] Provider Note   CSN: [id number] Arrival date & time: 12/20/23  4098     History  Chief Complaint  Patient presents with   Abdominal Pain   Back Pain    Elaine Harper is a 68 y.o. female.  Patient is a 68 year old female with a history of hypertension, prior complete abdominal hysterectomy and appendectomy presenting today with sudden onset of severe abdominal pain at 7:00 this morning which has moved into her chest and back.  She denies any nausea vomiting.  No urinary complaints.  No changes in her bowel movements.  Felt completely fine when she woke up this morning.  Had breakfast around 6 AM and then when she went to brush her teeth his when the pain started.  She reports nothing will make the pain better and it is only getting worse.  She has some mild pain with taking a deep breath but pain is all mostly in the areas described seems to be mostly in the upper abdomen.  She denies any numbness or tingling in her arms or legs.  No weakness of her legs.  The history is provided by the patient, the spouse and medical records.  Abdominal Pain Back Pain Associated symptoms: abdominal pain        Home Medications Prior to Admission medications   Medication Sig Start Date End Date Taking? Authorizing Provider  calcium elemental as carbonate (TUMS ULTRA 1000) 400 MG chewable tablet Chew 2,000 mg by mouth daily.    [provider]  cholecalciferol (VITAMIN D) 1000 units tablet Take 1,000 Units by mouth daily.    [provider]  estradiol (ESTRACE) 1 MG tablet Take 0.5 mg by mouth daily.    [provider]  famotidine (PEPCID) 20 MG tablet Take 20 mg by mouth 2 (two) times daily.    [provider]  fluorouracil (EFUDEX) 5 % cream Apply 1 Application topically as needed (precancerous spots). 04/16/17   [provider]  ibuprofen (ADVIL) 200 MG tablet Take 400 mg by mouth every 6  (six) hours as needed.    [provider]  lisinopril (ZESTRIL) 5 MG tablet Take 5 mg by mouth daily.    [provider]  LORazepam (ATIVAN) 1 MG tablet Take 1 tablet by mouth at bedtime as needed Patient taking differently: Take 0.5 mg by mouth daily as needed for anxiety. 04/16/21     Magnesium 250 MG TABS Take 250 mg by mouth daily.    [provider]  meloxicam (MOBIC) 15 MG tablet Take 15 mg by mouth daily.    [provider]  metroNIDAZOLE 1.3 % GEL  09/07/23   [provider]  Polyethylene Glycol 400 (BLINK TEARS) 0.25 % SOLN Place 1 drop into both eyes daily as needed (dry eyes).    [provider]  rosuvastatin (CRESTOR) 5 MG tablet Take 5 mg by mouth daily.    [provider]  spironolactone (ALDACTONE) 50 MG tablet TAKE 1 TABLET BY MOUTH DAILY Patient taking differently: Take 25 mg by mouth daily. 10/08/20 06/24/23  Venancio Poisson, MD  TURMERIC PO Take 1 tablet by mouth daily.    [provider]      Allergies    Hydrochlorothiazide and Lyrica [pregabalin]    Review of Systems   Review of Systems  Gastrointestinal:  Positive for abdominal pain.  Musculoskeletal:  Positive for back pain.    Physical Exam Updated Vital Signs BP  122/64   Pulse 64   Temp 98.3 F (36.8 C)   Resp 16   Ht 5\' 7"  (1.702 m)   Wt 72.1 kg   SpO2 100%   BMI 24.90 kg/m  Physical Exam Vitals and nursing note reviewed.  Constitutional:      General: She is in acute distress.     Appearance: She is well-developed.  HENT:     Head: Normocephalic and atraumatic.  Eyes:     Pupils: Pupils are equal, round, and reactive to light.  Cardiovascular:     Rate and Rhythm: Normal rate and regular rhythm.     Pulses: Normal pulses.     Heart sounds: Normal heart sounds. No murmur heard.    No friction rub.  Pulmonary:     Effort: Pulmonary effort is normal.     Breath sounds: Normal breath sounds. No wheezing or rales.  Abdominal:      General: Bowel sounds are normal. There is no distension.     Palpations: Abdomen is soft.     Tenderness: There is abdominal tenderness in the right upper quadrant, epigastric area, periumbilical area and left upper quadrant. There is no guarding or rebound.     Hernia: No hernia is present.  Musculoskeletal:        General: No tenderness. Normal range of motion.     Right lower leg: No edema.     Left lower leg: No edema.     Comments: No edema  Skin:    General: Skin is warm and dry.     Findings: No rash.  Neurological:     Mental Status: She is alert and oriented to person, place, and time. Mental status is at baseline.     Cranial Nerves: No cranial nerve deficit.     Sensory: No sensory deficit.     Motor: No weakness.  Psychiatric:        Behavior: Behavior normal.     ED Results / Procedures / Treatments   Labs (all labs ordered are listed, but only abnormal results are displayed) Labs Reviewed  COMPREHENSIVE METABOLIC PANEL - Abnormal; Notable for the following components:      Result Value   Glucose, Bld 113 (*)    Total Protein 6.3 (*)    AST 54 (*)    ALT 48 (*)    All other components within normal limits  URINALYSIS, ROUTINE W REFLEX MICROSCOPIC - Abnormal; Notable for the following components:   APPearance CLOUDY (*)    All other components within normal limits  LIPASE, BLOOD  CBC  TROPONIN I (HIGH SENSITIVITY)  TROPONIN I (HIGH SENSITIVITY)    EKG EKG Interpretation Date/Time:  Sunday December 20 2023 10:08:34 EST Ventricular Rate:  66 PR Interval:  148 QRS Duration:  66 QT Interval:  386 QTC Calculation: 404 R Axis:   48  Text Interpretation: Normal sinus rhythm Low voltage QRS Nonspecific T wave abnormality No significant change since last tracing When compared with ECG of 19-Jan-2023 14:37, PREVIOUS ECG IS PRESENT Confirmed by Gwyneth Sprout ([id number]) on 12/20/2023 10:33:23 AM  Radiology US Abdomen Limited RUQ (LIVER/GB) Result Date:  12/20/2023 CLINICAL DATA:  Epigastric pain. EXAM: ULTRASOUND ABDOMEN LIMITED RIGHT UPPER QUADRANT COMPARISON:  None Available. FINDINGS: Gallbladder: No gallstones or wall thickening visualized. No sonographic Murphy sign noted by sonographer. Common bile duct: Diameter: Mildly dilated measuring 8 mm in diameter. Liver: No focal lesion identified. Within normal limits in parenchymal echogenicity. Portal vein is patent on  color Doppler imaging with normal direction of blood flow towards the liver. Other: None. IMPRESSION: No evidence of cholelithiasis or cholecystitis. Mild dilatation of common bile duct measuring 8 mm. Recommend correlation with liver function tests, and consider MRCP without and with contrast for further evaluation if clinically warranted. Electronically Signed   By: Danae Orleans M.D.   On: 12/20/2023 13:36   CT Angio Chest/Abd/Pel for Dissection W and/or Wo Contrast Result Date: 12/20/2023 CLINICAL DATA:  68 year old female with sudden onset of pain in the chest, abdomen, radiating to the back. EXAM: CT ANGIOGRAPHY CHEST, ABDOMEN AND PELVIS TECHNIQUE: Non-contrast CT of the chest was initially obtained. Multidetector CT imaging through the chest, abdomen and pelvis was performed using the standard protocol during bolus administration of intravenous contrast. Multiplanar reconstructed images and MIPs were obtained and reviewed to evaluate the vascular anatomy. RADIATION DOSE REDUCTION: This exam was performed according to the departmental dose-optimization program which includes automated exposure control, adjustment of the mA and/or kV according to patient size and/or use of iterative reconstruction technique. CONTRAST:  OMNIPAQUE IOHEXOL 350 MG/ML SOLN COMPARISON:  Report of CT Abdomen and Pelvis 01/24/2002 (no images available). FINDINGS: CTA CHEST FINDINGS Cardiovascular: Normal heart size and no pericardial effusion. Negative thoracic aorta, no aneurysm or dissection. Minimal  calcified atherosclerosis in the thorax. Central pulmonary arteries also enhancing and appear to be patent. Mediastinum/Nodes: Negative. No mediastinal mass or lymphadenopathy. Lungs/Pleura: Major airways are patent. Somewhat low lung volumes. Symmetric mild dependent atelectasis. No pleural effusion or consolidation. No convincing active lung inflammation. No suspicious pulmonary nodule. Musculoskeletal: No acute osseous abnormality identified. Review of the MIP images confirms the above findings. CTA ABDOMEN AND PELVIS FINDINGS VASCULAR Negative for abdominal aortic aneurysm or dissection. Major abdominal aortic branches are patent. Mild Aortoiliac calcified atherosclerosis. Bilateral iliac and proximal femoral arteries are patent. Mild postoperative changes in the soft tissues overlying the proximal femoral arteries, small surgical clips. No portal venous phase enhancement. Review of the MIP images confirms the above findings. NON-VASCULAR Hepatobiliary: Negative early enhancement appearance of the liver. Mildly distended but otherwise unremarkable gallbladder. No pericholecystic inflammation identified. Pancreas: Negative. Spleen: Negative. Adrenals/Urinary Tract: Normal adrenal glands. Nonobstructed kidneys with symmetric renal enhancement. No evidence of nephrolithiasis or renal inflammation. Decompressed ureters and urinary bladder. Stomach/Bowel: No dilated large or small bowel. Sigmoid diverticulosis. Transverse colon diverticulosis. Right colon diverticulosis. No convincing active inflammation in those segments. Diminutive or absent appendix, no pericecal inflammation. Decompressed terminal ileum. No dilated small bowel. Retained fluid in the stomach. Duodenum is decompressed. No free air or free fluid. No mesenteric inflammation identified.7 Lymphatic: No lymphadenopathy. Reproductive: Surgically absent uterus. Diminutive or absent ovaries. Other: No pelvis free fluid. Musculoskeletal: Chronic lumbosacral  junction operative fusion. Chronic decompression there. No acute osseous abnormality identified. Review of the MIP images confirms the above findings. IMPRESSION: 1. Negative for abdominal aortic dissection or aneurysm. Mild for age Aortic Atherosclerosis (ICD10-I70.0). 2. No acute or inflammatory process identified in the chest, abdomen, or pelvis. 3. Colonic diverticulosis, no active inflammation identified. 4. Previous lumbosacral junction decompression and fusion. Electronically Signed   By: Odessa Fleming M.D.   On: 12/20/2023 11:44    Procedures Procedures    Medications Ordered in ED Medications  ondansetron (ZOFRAN) injection 4 mg (4 mg Intravenous Given 12/20/23 1046)  morphine (PF) 4 MG/ML injection 4 mg (4 mg Intravenous Given 12/20/23 1046)  iohexol (OMNIPAQUE) 350 MG/ML injection 100 mL (100 mLs Intravenous Contrast Given 12/20/23 1108)  HYDROmorphone (DILAUDID) injection 1  mg (1 mg Intravenous Given 12/20/23 1137)    ED Course/ Medical Decision Making/ A&P                                 Medical Decision Making Amount and/or Complexity of Data Reviewed Labs: ordered. Decision-making details documented in ED Course. Radiology: ordered and independent interpretation performed. Decision-making details documented in ED Course. ECG/medicine tests: ordered and independent interpretation performed. Decision-making details documented in ED Course.  Risk Prescription drug management.   Pt with multiple medical problems and comorbidities and presenting today with a complaint that caries a high risk for morbidity and mortality.  Here with sudden onset of severe pain.  Concern for dissection, AAA, Cholelithiasis, mesenteric ischemia.  Lower suspicion for kidney stone or acute lung pathology.  Patient given pain control.  She was made a code medical to get imaging quickly.  Blood pressure at this time is stable.  3:36 PM I independently interpreted patient's labs and EKG.  EKG without acute  findings.  CBC within normal limits, troponin is negative, lipase within normal limits, CMP with mild elevated LFTs with AST of 54 and ALT of 48 but normal bilirubin.  I have independently visualized and interpreted pt's images today.  CT chest abdomen and pelvis dissection study was negative for acute lung or vessel pathology.  Radiology does report some distention of the gallbladder but no evidence of stones.  Also they report no evidence of aneurysm or dissection or other acute findings.  Patient required 2 rounds of pain medication but on reevaluation she reports feeling better but still has some epigastric pain.  Will do a right upper quadrant ultrasound to further evaluate the gallbladder.  3:36 PM Ultrasound did not show any signs of stones or cholecystitis cholelithiasis however did show mild dilation of the common bile duct measuring 8 mm and they recommended correlation with LFTs and consider an MRCP.  Patient's LFTs are mildly elevated.  On repeat evaluation she reports her pain is improved.  Will try a p.o. challenge and reassess.  3:36 PM Patient did well with p.o. challenge.  At this time will discharge home but did give return precautions.         Final Clinical Impression(s) / ED Diagnoses Final diagnoses:  Epigastric abdominal pain    Rx / DC Orders ED Discharge Orders     None         Gwyneth Sprout, MD 12/20/23 1536

## 2023-12-20 NOTE — ED Notes (Signed)
 Pt requesting medication for abdominal pain.  Pt informed she will need to be seen by an EDP to receive pain medication, because it can skew their assessment if pain medication is given in triage.

## 2023-12-30 ENCOUNTER — Other Ambulatory Visit

## 2023-12-30 ENCOUNTER — Ambulatory Visit: Admitting: Gastroenterology

## 2023-12-30 ENCOUNTER — Encounter: Payer: Self-pay | Admitting: Gastroenterology

## 2023-12-30 VITALS — BP 116/72 | HR 69 | Ht 67.0 in | Wt 159.0 lb

## 2023-12-30 DIAGNOSIS — R1084 Generalized abdominal pain: Secondary | ICD-10-CM | POA: Diagnosis not present

## 2023-12-30 DIAGNOSIS — R7989 Other specified abnormal findings of blood chemistry: Secondary | ICD-10-CM | POA: Diagnosis not present

## 2023-12-30 LAB — HEPATIC FUNCTION PANEL
ALT: 47 U/L — ABNORMAL HIGH (ref 0–35)
AST: 22 U/L (ref 0–37)
Albumin: 4.7 g/dL (ref 3.5–5.2)
Alkaline Phosphatase: 104 U/L (ref 39–117)
Bilirubin, Direct: 0.1 mg/dL (ref 0.0–0.3)
Total Bilirubin: 0.5 mg/dL (ref 0.2–1.2)
Total Protein: 7.6 g/dL (ref 6.0–8.3)

## 2023-12-30 NOTE — Progress Notes (Addendum)
 12/30/2023 Elaine Harper [id number] [birth year]   HISTORY OF PRESENT ILLNESS: This is a 68 year old female who is new to our office.  She has past medical history as listed below, includes GERD, hypertension, hyperlipidemia, history of endometriosis remotely, history of kidney stones, etc.  She presents here today at the referral request of her PCP, Dr. Candi Chafe, for evaluation of upper abdominal pain.  She reports that a couple of weeks ago she had sudden onset of abdominal pain that radiated into her back and then into her chest.  No associated nausea, vomiting, diarrhea, etc.  Pain was severe with no similar symptoms in the past.  CT angio chest/abdomen/pelvis okay.  Labs okay.  Did have a very minimal bump in her LFTs with an AST of 54 and ALT of 48 with normal total bili and alk phos.  Ultrasound suggested some mildly dilated CBD at 8 mm.  Pain tapered off and resolved, has not been recurrent.  She states she continued with some minimal discomfort for couple of days, but eventually completely resolved with no residual symptoms.  Ultrasound:  IMPRESSION: No evidence of cholelithiasis or cholecystitis.   Mild dilatation of common bile duct measuring 8 mm. Recommend correlation with liver function tests, and consider MRCP without and with contrast for further evaluation if clinically warranted.   She was previously patient at [facility] and tells me that her last colonoscopy there was with Dr. Elsie Halo about 4 years or so ago.  Past Medical History:  Diagnosis Date   Anxiety    Arthritis of carpometacarpal (CMC) joint of right thumb    Complication of anesthesia    hard to put to sleep   GERD (gastroesophageal reflux disease)    Hyperlipidemia    Hypertension    Kidney stones    Menopause    surgical   PAT (paroxysmal atrial tachycardia) (HCC)    Pneumonia    Raynaud's phenomenon    Vitamin D deficiency    Past Surgical History:  Procedure Laterality Date   ABDOMINAL  HYSTERECTOMY     ABLATION ON ENDOMETRIOSIS     CARPOMETACARPEL SUSPENSION PLASTY Right 07/02/2023   Procedure: RIGHT THUMB TRAPEZIECTOMY AND SUSPENSIONPLASTY;  Surgeon: Brunilda Capra, MD;  Location: [facility];  Service: Orthopedics;  Laterality: Right;  90 MIN   FOOT SURGERY  1987   removed part the bone in foot   LAPAROSCOPIC APPENDECTOMY     LUMBAR FUSION  2024   Dr Larrie Po   MELANOMA EXCISION N/A 09/16/2018   Procedure: WIDE LOCAL EXCISION WITH ADVANCEMENT FLAP CLOSURE;  Surgeon: Lockie Rima, MD;  Location: [facility];  Service: General;  Laterality: N/A;   MICRODISCECTOMY LUMBAR  2023   Dr. Biagio Bucy NODE BIOPSY Right 09/16/2018   Procedure: SENTINEL NODE BIOPSY OF RIGHT LOWER LEG MELANOMA;  Surgeon: Lockie Rima, MD;  Location: [facility];  Service: General;  Laterality: Right;   TENDON TRANSFER Right 07/02/2023   Procedure: TENDON TRANSFER;  Surgeon: Brunilda Capra, MD;  Location:  SURGERY CENTER;  Service: Orthopedics;  Laterality: Right;    reports that she quit smoking about 36 years ago. Her smoking use included cigarettes. She has never used smokeless tobacco. She reports current alcohol use. She reports that she does not use drugs. family history includes Alzheimer's disease in her father; Cirrhosis in her brother; Hypertension in her mother and sister. Allergies  Allergen Reactions   Hydrochlorothiazide Other (See Comments)  Severe muscle cramps.   Lyrica [Pregabalin]     Headache / acid reflux / insomnia / dizziness       Outpatient Encounter Medications as of 12/30/2023  Medication Sig   calcium  elemental as carbonate (TUMS ULTRA 1000) 400 MG chewable tablet Chew 2,000 mg by mouth daily.   cholecalciferol (VITAMIN D) 1000 units tablet Take 1,000 Units by mouth daily.   estradiol  (ESTRACE ) 1 MG tablet Take 0.5 mg by mouth daily.   famotidine  (PEPCID ) 20 MG tablet Take 20 mg by mouth daily.   fluorouracil  (EFUDEX) 5 % cream Apply 1 Application topically as needed (precancerous spots).   lisinopril  (ZESTRIL ) 5 MG tablet Take 5 mg by mouth daily.   LORazepam  (ATIVAN ) 1 MG tablet Take 1 tablet by mouth at bedtime as needed (Patient taking differently: Take 0.5 mg by mouth daily as needed for anxiety.)   Magnesium 250 MG TABS Take 250 mg by mouth daily.   metroNIDAZOLE 1.3 % GEL    Polyethylene Glycol 400 (BLINK TEARS) 0.25 % SOLN Place 1 drop into both eyes daily as needed (dry eyes).   rosuvastatin  (CRESTOR ) 5 MG tablet Take 5 mg by mouth daily.   spironolactone  (ALDACTONE ) 50 MG tablet TAKE 1 TABLET BY MOUTH DAILY (Patient taking differently: Take 25 mg by mouth daily.)   TURMERIC PO Take 1 tablet by mouth daily.   [DISCONTINUED] ibuprofen (ADVIL) 200 MG tablet Take 400 mg by mouth every 6 (six) hours as needed.   [DISCONTINUED] meloxicam (MOBIC) 15 MG tablet Take 15 mg by mouth daily.   No facility-administered encounter medications on file as of 12/30/2023.    REVIEW OF SYSTEMS  : All other systems reviewed and negative except where noted in the History of Present Illness.   PHYSICAL EXAM: BP 116/72   Pulse 69   Ht 5\' 7"  (1.702 m)   Wt 159 lb (72.1 kg)   BMI 24.90 kg/m  General: Well developed white female in no acute distress Head: Normocephalic and atraumatic Eyes:  Sclerae anicteric, conjunctiva pink. Ears: Normal auditory acuity Lungs: Clear throughout to auscultation; no W/R/R. Heart: Regular rate and rhythm; no M/R/G. Abdomen: Soft, non-distended.  BS present.  Mild diffuse TTP. Musculoskeletal: Symmetrical with no gross deformities  Skin: No lesions on visible extremities Extremities: No edema  Neurological: Alert oriented x 4, grossly non-focal Psychological:  Alert and cooperative. Normal mood and affect  ASSESSMENT AND PLAN: *68 year old female with a one-time episode of sudden onset abdominal pain that radiated into her back and then into her chest.  No associated  nausea, vomiting, diarrhea, etc.  Pain was severe with no similar symptoms in the past.  CT angio chest/abdomen/pelvis okay.  Labs okay.  Did have a very minimal bump in her LFTs with an AST of 54 and ALT of 48 with normal total bili and alk phos.  Ultrasound suggested some mildly dilated CBD at 8 mm.  Pain tapered off and resolved, has not been recurrent.  Will recheck LFTs today.  If they remain elevated we discussed proceeding with MRI abdomen/MRCP to better look at the biliary tract.  She is agreeable.  Will have her sign for previous colonoscopy records from [facility].  She states the last was about 4 years ago.  **Addendum: I just received records from [facility].  The date is 02/16/2024.  Colonoscopy was performed April 24, 2016 at which time the entire examined colon was normal.  Repeat was recommended in 10 years.   CC:  Tena Feeling, MD

## 2023-12-30 NOTE — Patient Instructions (Signed)
 Your provider has requested that you go to the basement level for lab work before leaving today. Press "B" on the elevator. The lab is located at the first door on the left as you exit the elevator.  We will obtain your records from [facility].  _______________________________________________________  If your blood pressure at your visit was 140/90 or greater, please contact your primary care physician to follow up on this.  _______________________________________________________  If you are age 22 or older, your body mass index should be between 23-30. Your Body mass index is 24.9 kg/m. If this is out of the aforementioned range listed, please consider follow up with your Primary Care Provider.  If you are age 48 or younger, your body mass index should be between 19-25. Your Body mass index is 24.9 kg/m. If this is out of the aformentioned range listed, please consider follow up with your Primary Care Provider.   ________________________________________________________  The [place] [facility] providers would like to encourage you to use [facility] to communicate with providers for non-urgent requests or questions.  Due to long hold times on the telephone, sending your provider a message by [facility] may be a faster and more efficient way to get a response.  Please allow 48 business hours for a response.  Please remember that this is for non-urgent requests.  _______________________________________________________

## 2023-12-31 ENCOUNTER — Other Ambulatory Visit: Payer: Self-pay

## 2023-12-31 DIAGNOSIS — R7989 Other specified abnormal findings of blood chemistry: Secondary | ICD-10-CM

## 2023-12-31 NOTE — Progress Notes (Signed)
 Addendum: Reviewed and agree with assessment and management plan. Would proceed with MRI abdomen with and without contrast plus MRCP; bile duct dilation by ultrasound and elevated liver enzymes --rule out CBD stone Lacey Wallman, Carie Caddy, MD

## 2024-01-01 ENCOUNTER — Ambulatory Visit: Payer: Medicare Other | Admitting: Family Medicine

## 2024-01-27 ENCOUNTER — Other Ambulatory Visit: Payer: Self-pay | Admitting: *Deleted

## 2024-01-27 DIAGNOSIS — R9389 Abnormal findings on diagnostic imaging of other specified body structures: Secondary | ICD-10-CM

## 2024-01-27 DIAGNOSIS — R7989 Other specified abnormal findings of blood chemistry: Secondary | ICD-10-CM

## 2024-01-29 ENCOUNTER — Other Ambulatory Visit

## 2024-01-29 DIAGNOSIS — R7989 Other specified abnormal findings of blood chemistry: Secondary | ICD-10-CM | POA: Diagnosis not present

## 2024-01-29 LAB — HEPATIC FUNCTION PANEL
ALT: 34 U/L (ref 0–35)
AST: 20 U/L (ref 0–37)
Albumin: 4.5 g/dL (ref 3.5–5.2)
Alkaline Phosphatase: 80 U/L (ref 39–117)
Bilirubin, Direct: 0 mg/dL (ref 0.0–0.3)
Total Bilirubin: 0.4 mg/dL (ref 0.2–1.2)
Total Protein: 6.9 g/dL (ref 6.0–8.3)

## 2024-02-05 ENCOUNTER — Other Ambulatory Visit: Payer: Self-pay | Admitting: *Deleted

## 2024-02-05 ENCOUNTER — Encounter: Payer: Self-pay | Admitting: Internal Medicine

## 2024-02-05 ENCOUNTER — Ambulatory Visit (HOSPITAL_COMMUNITY)
Admission: RE | Admit: 2024-02-05 | Discharge: 2024-02-05 | Disposition: A | Discharge status: Home / Self Care | Source: Ambulatory Visit | Attending: Internal Medicine | Admitting: Internal Medicine

## 2024-02-05 ENCOUNTER — Other Ambulatory Visit (HOSPITAL_COMMUNITY): Payer: Self-pay | Admitting: Internal Medicine

## 2024-02-05 DIAGNOSIS — R9389 Abnormal findings on diagnostic imaging of other specified body structures: Secondary | ICD-10-CM | POA: Diagnosis not present

## 2024-02-05 DIAGNOSIS — I700 Atherosclerosis of aorta: Secondary | ICD-10-CM | POA: Diagnosis not present

## 2024-02-05 DIAGNOSIS — R7989 Other specified abnormal findings of blood chemistry: Secondary | ICD-10-CM

## 2024-02-05 DIAGNOSIS — R109 Unspecified abdominal pain: Secondary | ICD-10-CM | POA: Diagnosis not present

## 2024-02-05 DIAGNOSIS — R52 Pain, unspecified: Secondary | ICD-10-CM | POA: Diagnosis not present

## 2024-02-05 DIAGNOSIS — R1084 Generalized abdominal pain: Secondary | ICD-10-CM

## 2024-02-05 MED ORDER — GADOBUTROL 1 MMOL/ML IV SOLN
7.0000 mL | Freq: Once | INTRAVENOUS | Status: AC | PRN
  Administered 2024-02-05: 7 mL via INTRAVENOUS

## 2024-03-29 DIAGNOSIS — Z85820 Personal history of malignant melanoma of skin: Secondary | ICD-10-CM | POA: Diagnosis not present

## 2024-03-29 DIAGNOSIS — L814 Other melanin hyperpigmentation: Secondary | ICD-10-CM | POA: Diagnosis not present

## 2024-03-29 DIAGNOSIS — L570 Actinic keratosis: Secondary | ICD-10-CM | POA: Diagnosis not present

## 2024-03-29 DIAGNOSIS — C44519 Basal cell carcinoma of skin of other part of trunk: Secondary | ICD-10-CM | POA: Diagnosis not present

## 2024-03-29 DIAGNOSIS — D692 Other nonthrombocytopenic purpura: Secondary | ICD-10-CM | POA: Diagnosis not present

## 2024-03-29 DIAGNOSIS — L718 Other rosacea: Secondary | ICD-10-CM | POA: Diagnosis not present

## 2024-03-29 DIAGNOSIS — Z85828 Personal history of other malignant neoplasm of skin: Secondary | ICD-10-CM | POA: Diagnosis not present

## 2024-03-29 DIAGNOSIS — L821 Other seborrheic keratosis: Secondary | ICD-10-CM | POA: Diagnosis not present

## 2024-05-17 DIAGNOSIS — M4317 Spondylolisthesis, lumbosacral region: Secondary | ICD-10-CM | POA: Diagnosis not present

## 2024-05-30 DIAGNOSIS — I4719 Other supraventricular tachycardia: Secondary | ICD-10-CM | POA: Diagnosis not present

## 2024-05-30 DIAGNOSIS — Z0000 Encounter for general adult medical examination without abnormal findings: Secondary | ICD-10-CM | POA: Diagnosis not present

## 2024-05-30 DIAGNOSIS — Z79899 Other long term (current) drug therapy: Secondary | ICD-10-CM | POA: Diagnosis not present

## 2024-05-30 DIAGNOSIS — K219 Gastro-esophageal reflux disease without esophagitis: Secondary | ICD-10-CM | POA: Diagnosis not present

## 2024-05-30 DIAGNOSIS — R195 Other fecal abnormalities: Secondary | ICD-10-CM | POA: Diagnosis not present

## 2024-05-30 DIAGNOSIS — Z23 Encounter for immunization: Secondary | ICD-10-CM | POA: Diagnosis not present

## 2024-05-30 DIAGNOSIS — E559 Vitamin D deficiency, unspecified: Secondary | ICD-10-CM | POA: Diagnosis not present

## 2024-05-30 DIAGNOSIS — I10 Essential (primary) hypertension: Secondary | ICD-10-CM | POA: Diagnosis not present

## 2024-05-30 DIAGNOSIS — Z13220 Encounter for screening for lipoid disorders: Secondary | ICD-10-CM | POA: Diagnosis not present

## 2024-06-06 DIAGNOSIS — Z1231 Encounter for screening mammogram for malignant neoplasm of breast: Secondary | ICD-10-CM | POA: Diagnosis not present

## 2024-06-14 DIAGNOSIS — M79642 Pain in left hand: Secondary | ICD-10-CM | POA: Diagnosis not present

## 2024-06-14 DIAGNOSIS — M1812 Unilateral primary osteoarthritis of first carpometacarpal joint, left hand: Secondary | ICD-10-CM | POA: Diagnosis not present

## 2024-07-12 DIAGNOSIS — H01002 Unspecified blepharitis right lower eyelid: Secondary | ICD-10-CM | POA: Diagnosis not present

## 2024-07-12 DIAGNOSIS — H52203 Unspecified astigmatism, bilateral: Secondary | ICD-10-CM | POA: Diagnosis not present

## 2024-07-12 DIAGNOSIS — H2513 Age-related nuclear cataract, bilateral: Secondary | ICD-10-CM | POA: Diagnosis not present

## 2024-07-12 DIAGNOSIS — H01005 Unspecified blepharitis left lower eyelid: Secondary | ICD-10-CM | POA: Diagnosis not present

## 2024-08-19 ENCOUNTER — Other Ambulatory Visit (HOSPITAL_COMMUNITY): Payer: Self-pay | Admitting: Internal Medicine

## 2024-08-19 DIAGNOSIS — R221 Localized swelling, mass and lump, neck: Secondary | ICD-10-CM | POA: Diagnosis not present

## 2024-08-19 DIAGNOSIS — M542 Cervicalgia: Secondary | ICD-10-CM

## 2024-08-26 ENCOUNTER — Ambulatory Visit (HOSPITAL_COMMUNITY)
Admission: RE | Admit: 2024-08-26 | Discharge: 2024-08-26 | Disposition: A | Discharge status: Home / Self Care | Source: Ambulatory Visit | Attending: Internal Medicine | Admitting: Internal Medicine

## 2024-08-26 ENCOUNTER — Encounter (HOSPITAL_COMMUNITY): Payer: Self-pay

## 2024-08-26 DIAGNOSIS — M542 Cervicalgia: Secondary | ICD-10-CM | POA: Insufficient documentation

## 2024-08-26 DIAGNOSIS — R221 Localized swelling, mass and lump, neck: Secondary | ICD-10-CM | POA: Diagnosis present

## 2024-09-13 ENCOUNTER — Encounter (INDEPENDENT_AMBULATORY_CARE_PROVIDER_SITE_OTHER): Payer: Self-pay

## 2024-09-13 ENCOUNTER — Ambulatory Visit (INDEPENDENT_AMBULATORY_CARE_PROVIDER_SITE_OTHER)

## 2024-09-13 VITALS — BP 136/83 | HR 67 | Temp 98.0°F | Wt 159.0 lb

## 2024-09-13 DIAGNOSIS — R490 Dysphonia: Secondary | ICD-10-CM

## 2024-09-13 DIAGNOSIS — J385 Laryngeal spasm: Secondary | ICD-10-CM | POA: Diagnosis not present

## 2024-09-13 DIAGNOSIS — K219 Gastro-esophageal reflux disease without esophagitis: Secondary | ICD-10-CM | POA: Diagnosis not present

## 2024-09-13 MED ORDER — OMEPRAZOLE 20 MG PO CPDR: 20.0000 mg | DELAYED_RELEASE_CAPSULE | Freq: Two times a day (BID) | ORAL | 3 refills | Status: AC

## 2024-09-13 NOTE — Progress Notes (Signed)
 Dear Dr. Dwight, Here is my assessment for our mutual patient, Elaine Harper. Thank you for allowing me the opportunity to care for your patient. Please do not hesitate to contact me should you have any other questions. Sincerely, Dr. Penne Croak  Otolaryngology Clinic Note Referring provider: Dr. Dwight HPI:  Discussed the use of AI scribe software for clinical note transcription with the patient, who gave verbal consent to proceed.  History of Present Illness Elaine Harper Karaline Buresh is a 68 year old female who presents with throat discomfort and spasms. She was referred by Dr. Dwight for evaluation of throat discomfort and a thyroid  nodule.  Throat discomfort and spasms - Intermittent sensation of throat discomfort described as 'strangling or being in a bear hug' - Episodes last approximately ten seconds - Occurs several times during morning walks - No association with shortness of breath - Does not occur while lying down - Occasionally begins after eating, but not consistently related to food intake - Symptoms present for approximately two months  Thyroid  nodule evaluation - Small knot palpated on the side of the neck two months ago - Thyroid  ultrasound performed with no concerning findings  Hoarseness and voice changes - History of hoarseness - Previous ENT evaluation due to voice changes - Background in opera singing - Attributes some past voice issues to 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    PMH/Meds/All/SocHx/FamHx/ROS:   Past Medical History:  Diagnosis Date   Anxiety    Arthritis of carpometacarpal (CMC) joint of right thumb    Complication of anesthesia    hard to put to sleep   GERD (gastroesophageal reflux disease)    Hyperlipidemia    Hypertension    Kidney stones    Menopause    surgical   PAT (paroxysmal atrial tachycardia)    Pneumonia    Raynaud's phenomenon    Vitamin D deficiency      Past Surgical History:  Procedure Laterality Date   ABDOMINAL HYSTERECTOMY     ABLATION ON ENDOMETRIOSIS     CARPOMETACARPEL SUSPENSION PLASTY Right 07/02/2023   Procedure: RIGHT THUMB TRAPEZIECTOMY AND SUSPENSIONPLASTY;  Surgeon: Murrell Drivers, MD;  Location: [facility];  Service: Orthopedics;  Laterality: Right;  90 MIN   FOOT SURGERY  1987   removed part the bone in foot   LAPAROSCOPIC APPENDECTOMY     LUMBAR FUSION  2024   Dr Mavis   MELANOMA EXCISION N/A 09/16/2018   Procedure: WIDE LOCAL EXCISION WITH ADVANCEMENT FLAP CLOSURE;  Surgeon: Aron Shoulders, MD;  Location: [facility];  Service: General;  Laterality: N/A;   MICRODISCECTOMY LUMBAR  2023   Dr. Mavis OSLER NODE BIOPSY Right 09/16/2018   Procedure: SENTINEL NODE BIOPSY OF RIGHT LOWER LEG MELANOMA;  Surgeon: Aron Shoulders, MD;  Location: [facility];  Service: General;  Laterality: Right;   TENDON TRANSFER Right 07/02/2023   Procedure: TENDON TRANSFER;  Surgeon: Murrell Drivers, MD;  Location: [facility];  Service: Orthopedics;  Laterality: Right;    Family History  Problem Relation Age of Onset   Hypertension  Mother    Alzheimer's disease Father    Hypertension Sister    Cirrhosis Brother        alcoholism   Colon cancer Neg Hx    Esophageal cancer Neg Hx      Social Connections: Not on file      Current Outpatient Medications:    calcium  elemental as carbonate (TUMS ULTRA 1000) 400 MG chewable tablet, Chew 2,000 mg by mouth  daily., Disp: , Rfl:    cholecalciferol (VITAMIN D) 1000 units tablet, Take 1,000 Units by mouth daily., Disp: , Rfl:    estradiol  (ESTRACE ) 1 MG tablet, Take 0.5 mg by mouth daily., Disp: , Rfl:    famotidine  (PEPCID ) 20 MG tablet, Take 20 mg by mouth daily., Disp: , Rfl:    fluorouracil (EFUDEX) 5 % cream, Apply 1 Application topically as needed (precancerous spots)., Disp: , Rfl:    lisinopril  (ZESTRIL ) 5 MG tablet, Take 5 mg by mouth daily., Disp: , Rfl:    LORazepam  (ATIVAN ) 1 MG tablet, Take 1 tablet by mouth at bedtime as needed (Patient taking differently: Take 0.5 mg by mouth daily as needed for anxiety.), Disp: 90 tablet, Rfl: 0   Magnesium 250 MG TABS, Take 250 mg by mouth daily., Disp: , Rfl:    metroNIDAZOLE 1.3 % GEL, , Disp: , Rfl:    omeprazole (PRILOSEC) 20 MG capsule, Take 1 capsule (20 mg total) by mouth 2 (two) times daily before a meal., Disp: 60 capsule, Rfl: 3   Polyethylene Glycol 400 (BLINK TEARS) 0.25 % SOLN, Place 1 drop into both eyes daily as needed (dry eyes)., Disp: , Rfl:    rosuvastatin  (CRESTOR ) 5 MG tablet, Take 5 mg by mouth daily., Disp: , Rfl:    spironolactone  (ALDACTONE ) 50 MG tablet, TAKE 1 TABLET BY MOUTH DAILY (Patient taking differently: Take 25 mg by mouth daily.), Disp: 30 tablet, Rfl: 2   TURMERIC PO, Take 1 tablet by mouth daily., Disp: , Rfl:    Physical Exam:   BP 136/83 (BP Location: Left Arm, Patient Position: Sitting, Cuff Size: Normal)   Pulse 67   Temp 98 F (36.7 C)   Wt 159 lb (72.1 kg)   SpO2 98%   BMI 24.90 kg/m   The patient was awake, alert, and appropriate. The external ears were inspected, and otoscopy was performed to evaluate the external auditory canals and tympanic membranes. The nasal cavity and septum were examined for mucosal changes, obstruction, or discharge. The oral cavity and oropharynx were inspected for mucosal lesions, infection, or tonsillar hypertrophy. The neck was palpated for lymphadenopathy, thyroid   abnormalities, or other masses. Cranial nerve function was grossly intact.  Pertinent Findings: Physical Exam HEENT: Vocal cords with normal function inferred. Nasal septum deviated. Large airway - former singer. NECK: Normal neck examination. Thyroid  small, no nodules.  Seprately Identifiable Procedures:  I personally ordered, reviewed and interpreted the following with the patient today  Procedure Note Pre-procedure diagnosis:  Dysphonia  and laryngeal spasm Post-procedure diagnosis: Same Procedure: Transnasal Fiberoptic Laryngoscopy, CPT 31575 - Mod 25 Indication: laryngeal spasm Complications: None apparent EBL: 0 mL  The procedure was undertaken to further evaluate the patient's complaint of hoarseness, with mirror exam inadequate for appropriate examination due to gag reflex and poor patient tolerance  Procedure:  Patient was identified as correct patient. Verbal consent was obtained. The nose was sprayed with oxymetazoline and 4% lidocaine . The The flexible laryngoscope was passed through the nose to view the nasal cavity, pharynx (oropharynx,  hypopharynx) and larynx.  The larynx was examined at rest and during multiple phonatory tasks. Documentation was obtained and reviewed with patient. The scope was removed. The patient tolerated the procedure well.  Findings: The nasal cavity and nasopharynx did not reveal any masses or lesions, mucosa appeared to be without obvious lesions. The tongue base, pharyngeal walls, piriform sinuses, vallecula, epiglottis and postcricoid region are normal in appearance EXCEPT: moderate arytenoid edema and erythema. The visualized portion of the subglottis and proximal trachea is widely patent. The vocal folds are mobile bilaterally. There are no lesions on the free edge of the vocal folds nor elsewhere in the larynx worrisome for malignancy.    Electronically signed by: Penne Croak, DO 09/13/2024 8:11 PM   Impression & Plans:  Patsye Sullivant is  a 68 y.o. female  1. Laryngeal spasm   2. Laryngopharyngeal reflux (LPR)     - Findings and diagnoses discussed in detail with the patient. - Risks, benefits, and alternatives were reviewed. Through shared decision making, the patient elects to proceed with below. Assessment & Plan Laryngeal spasm likely related to gastroesophageal reflux disease Intermittent laryngeal spasm likely triggered by reflux, with slight paradoxical vocal fold motion and laryngeal redness observed. - Prescribed omeprazole 20 mg twice daily for 2-3 months. - Continue famotidine  20 mg daily. - Follow-up in 2-3 months to assess symptom improvement and consider tapering omeprazole.  Dysphonia associated with laryngeal irritation Hoarseness due to reflux-related laryngeal irritation. - Address reflux with omeprazole and famotidine  as per laryngeal spasm plan.  Gastroesophageal reflux disease Chronic reflux managed with famotidine , identified as a trigger for laryngeal issues. - Continue famotidine  20 mg daily. - Initiated omeprazole 20 mg twice daily.  - Orders placed: No orders of the defined types were placed in this encounter.  - Medications prescribed/continued/adjusted:  Meds ordered this encounter  Medications   omeprazole (PRILOSEC) 20 MG capsule    Sig: Take 1 capsule (20 mg total) by mouth 2 (two) times daily before a meal.    Dispense:  60 capsule    Refill:  3   - Education materials provided to the patient. - Follow up: 2-3 months, discuss additional medication modification if LPR treatment ineffective. Patient instructed to return sooner or go to the ED if new/worsening symptoms develop.   Thank you for allowing me the opportunity to care for your patient. Please do not hesitate to contact me should you have any other questions.  Sincerely, Penne Croak, DO Otolaryngologist (ENT) [facility] Phone: [phone] Fax: [phone]  09/13/2024, 3:17 PM

## 2024-11-14 ENCOUNTER — Ambulatory Visit (INDEPENDENT_AMBULATORY_CARE_PROVIDER_SITE_OTHER)

## 2024-11-15 ENCOUNTER — Ambulatory Visit (INDEPENDENT_AMBULATORY_CARE_PROVIDER_SITE_OTHER)

## 2024-11-25 ENCOUNTER — Encounter (INDEPENDENT_AMBULATORY_CARE_PROVIDER_SITE_OTHER): Payer: Self-pay

## 2024-11-25 ENCOUNTER — Ambulatory Visit (INDEPENDENT_AMBULATORY_CARE_PROVIDER_SITE_OTHER)

## 2024-11-25 VITALS — BP 145/88 | HR 73 | Wt 159.0 lb

## 2024-11-25 DIAGNOSIS — K224 Dyskinesia of esophagus: Secondary | ICD-10-CM | POA: Diagnosis not present

## 2024-11-25 DIAGNOSIS — R490 Dysphonia: Secondary | ICD-10-CM | POA: Diagnosis not present

## 2024-11-25 DIAGNOSIS — K219 Gastro-esophageal reflux disease without esophagitis: Secondary | ICD-10-CM

## 2024-11-25 MED ORDER — DILTIAZEM HCL ER 60 MG PO CP12: 60.0000 mg | ORAL_CAPSULE | Freq: Two times a day (BID) | ORAL | 1 refills | Status: AC

## 2024-11-25 NOTE — Progress Notes (Unsigned)
 Dear Dr. Dwight, Here is my assessment for our mutual patient, Elaine Harper. Thank you for allowing me the opportunity to care for your patient. Please do not hesitate to contact me should you have any other questions. Sincerely, Dr. Penne Croak  Otolaryngology Clinic Note Referring provider: Dr. Dwight HPI:  Discussed the use of AI scribe software for clinical note transcription with the patient, who gave verbal consent to proceed.  History of Present Illness Elaine Harper Elaine Harper is a 69 year old female who presents with throat discomfort and spasms. She was referred by Dr. Dwight for evaluation of throat discomfort and a thyroid  nodule.  Throat discomfort and spasms - Intermittent sensation of throat discomfort described as 'strangling or being in a bear hug' - Episodes last approximately ten seconds - Occurs several times during morning walks - No association with shortness of breath - Does not occur while lying down - Occasionally begins after eating, but not consistently related to food intake - Symptoms present for approximately two months  Thyroid  nodule evaluation - Small knot palpated on the side of the neck two months ago - Thyroid  ultrasound performed with no concerning findings  Hoarseness and voice changes - History of hoarseness - Previous ENT evaluation due to voice changes - Background in opera singing - Attributes some past voice issues 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  Esophageal spasm and gastroesophageal  reflux disease who presents for evaluation of persistent esophageal tightness.  Esophageal Tightness and Spasm: - Persistent sensation of tightness or strangled feeling in the esophagus, occurring most mornings and intermittently throughout the day - Sensation often begins prior to morning dose of omeprazole  and is present daily with variable severity - On days when symptoms begin in the morning, increased severity is anticipated throughout the day - Episodes are brief, recur without a clear pattern, and are not associated with meals or specific activities - No dysphagia, food impaction, or shortness of breath - Occasionally feels compelled to take a deep breath during more severe episodes  Gastroesophageal Reflux Disease (GERD): - No reflux symptoms for an extended period - Continues omeprazole  twice daily and famotidine  for reflux control  Relevant Gastrointestinal and Cardiac History: - Previously evaluated by gastroenterology for suspected common bile duct stone - No history of esophagogastroduodenoscopy; prior colonoscopy performed - History of paroxysmal atrial tachycardia, without current cardiac symptoms or edema  Hypertension and Medication Intolerance: - Hypertension managed with lisinopril  and spironolactone  - Recent blood pressure readings 120-127/72 mmHg - Discontinued hydrochlorothiazide due to severe leg cramps  Independent Review of Additional Tests or Records:  Reviewed external note from referring PCP, Elaine Harper,describing relevant history incorporated into todays evaluation.   PMH/Meds/All/SocHx/FamHx/ROS:   Past Medical History:  Diagnosis Date   Anxiety    Arthritis of carpometacarpal (CMC) joint of right thumb    Complication of anesthesia    hard to put to sleep   GERD (gastroesophageal reflux disease)    Hyperlipidemia    Hypertension    Kidney stones    Menopause    surgical   PAT (paroxysmal atrial tachycardia)  Pneumonia    Raynaud's phenomenon     Vitamin D deficiency      Past Surgical History:  Procedure Laterality Date   ABDOMINAL HYSTERECTOMY     ABLATION ON ENDOMETRIOSIS     CARPOMETACARPEL SUSPENSION PLASTY Right 07/02/2023   Procedure: RIGHT THUMB TRAPEZIECTOMY AND SUSPENSIONPLASTY;  Surgeon: Murrell Drivers, MD;  Location: [facility];  Service: Orthopedics;  Laterality: Right;  90 MIN   FOOT SURGERY  1987   removed part the bone in foot   LAPAROSCOPIC APPENDECTOMY     LUMBAR FUSION  2024   Dr Mavis   MELANOMA EXCISION N/A 09/16/2018   Procedure: WIDE LOCAL EXCISION WITH ADVANCEMENT FLAP CLOSURE;  Surgeon: Aron Shoulders, MD;  Location: [facility];  Service: General;  Laterality: N/A;   MICRODISCECTOMY LUMBAR  2023   Dr. Mavis OSLER NODE BIOPSY Right 09/16/2018   Procedure: SENTINEL NODE BIOPSY OF RIGHT LOWER LEG MELANOMA;  Surgeon: Aron Shoulders, MD;  Location: [facility];  Service: General;  Laterality: Right;   TENDON TRANSFER Right 07/02/2023   Procedure: TENDON TRANSFER;  Surgeon: Murrell Drivers, MD;  Location: [facility];  Service: Orthopedics;  Laterality: Right;    Family History  Problem Relation Age of Onset   Hypertension Mother    Alzheimer's disease Father    Hypertension Sister    Cirrhosis Brother        alcoholism   Colon cancer Neg Hx    Esophageal cancer Neg Hx      Social Connections: Not on file      Current Outpatient Medications:    calcium  elemental as carbonate (TUMS ULTRA 1000) 400 MG chewable tablet, Chew 2,000 mg by mouth daily., Disp: , Rfl:    cholecalciferol (VITAMIN D) 1000 units tablet, Take 1,000 Units by mouth daily., Disp: , Rfl:    diltiazem  (CARDIZEM  SR) 60 MG 12 hr capsule, Take 1 capsule (60 mg total) by mouth 2 (two) times daily., Disp: 60 capsule, Rfl: 1   famotidine  (PEPCID ) 20 MG tablet, Take 20 mg by mouth daily., Disp: , Rfl:    fluorouracil (EFUDEX) 5 % cream, Apply 1 Application topically as needed  (precancerous spots)., Disp: , Rfl:    lisinopril  (ZESTRIL ) 5 MG tablet, Take 5 mg by mouth daily., Disp: , Rfl:    LORazepam  (ATIVAN ) 1 MG tablet, Take 1 tablet by mouth at bedtime as needed, Disp: 90 tablet, Rfl: 0   Magnesium 250 MG TABS, Take 250 mg by mouth daily., Disp: , Rfl:    metroNIDAZOLE (METROGEL) 0.75 % gel, SMARTSIG:sparingly Topical Twice Daily, Disp: , Rfl:    metroNIDAZOLE 1.3 % GEL, , Disp: , Rfl:    MNEXSPIKE 10 MCG/0.2ML SUSY, , Disp: , Rfl:    omeprazole  (PRILOSEC) 20 MG capsule, Take 1 capsule (20 mg total) by mouth 2 (two) times daily before a meal., Disp: 60 capsule, Rfl: 3   Polyethylene Glycol 400 (BLINK TEARS) 0.25 % SOLN, Place 1 drop into both eyes daily as needed (dry eyes)., Disp: , Rfl:    rosuvastatin  (CRESTOR ) 5 MG tablet, Take 5 mg by mouth daily., Disp: , Rfl:    spironolactone  (ALDACTONE ) 50 MG tablet, TAKE 1 TABLET BY MOUTH DAILY, Disp: 30 tablet, Rfl: 2   TURMERIC PO, Take 1 tablet by mouth daily., Disp: , Rfl:    estradiol  (ESTRACE ) 0.5 MG tablet, 1 tablet Orally Once a day; Duration: 90 days, Disp: , Rfl:    Physical  Exam:   BP (!) 145/88 (BP Location: Left Arm, Patient Position: Sitting, Cuff Size: Normal)   Pulse 73   Wt 159 lb (72.1 kg)   SpO2 98%   BMI 24.90 kg/m   The patient was awake, alert, and appropriate. The external ears were inspected, and otoscopy was performed to evaluate the external auditory canals and tympanic membranes. The nasal cavity and septum were examined for mucosal changes, obstruction, or discharge. The oral cavity and oropharynx were inspected for mucosal lesions, infection, or tonsillar hypertrophy. The neck was palpated for lymphadenopathy, thyroid  abnormalities, or other masses. Cranial nerve function was grossly intact.  Pertinent Findings: Physical Exam HEENT: Vocal cords with normal function inferred. Nasal septum deviated. Large airway - former singer. NECK: Normal neck examination. Thyroid  small, no  nodules. HEENT: Nose normal with midline septum. Oral cavity normal.   Seprately Identifiable Procedures:  I personally ordered, reviewed and interpreted the following with the patient today  Procedure Note Pre-procedure diagnosis:  Dysphonia  and laryngeal spasm Post-procedure diagnosis: Same Procedure: Transnasal Fiberoptic Laryngoscopy, CPT 31575 - Mod 25 Indication: laryngeal spasm Complications: None apparent EBL: 0 mL  The procedure was undertaken to further evaluate the patient's complaint of hoarseness, with mirror exam inadequate for appropriate examination due to gag reflex and poor patient tolerance  Procedure:  Patient was identified as correct patient. Verbal consent was obtained. The nose was sprayed with oxymetazoline and 4% lidocaine . The The flexible laryngoscope was passed through the nose to view the nasal cavity, pharynx (oropharynx, hypopharynx) and larynx.  The larynx was examined at rest and during multiple phonatory tasks. Documentation was obtained and reviewed with patient. The scope was removed. The patient tolerated the procedure well.  Findings: The nasal cavity and nasopharynx did not reveal any masses or lesions, mucosa appeared to be without obvious lesions. The tongue base, pharyngeal walls, piriform sinuses, vallecula, epiglottis and postcricoid region are normal in appearance EXCEPT: moderate arytenoid edema and erythema. The visualized portion of the subglottis and proximal trachea is widely patent. The vocal folds are mobile bilaterally. There are no lesions on the free edge of the vocal folds nor elsewhere in the larynx worrisome for malignancy.    Electronically signed by: Penne Croak, DO 11/27/2024 8:12 PM   Impression & Plans:  Elaine Harper is a 69 y.o. female  1. Esophageal spasm   2. Laryngopharyngeal reflux (LPR)   3. Hoarse voice quality    - Findings and diagnoses discussed in detail with the patient. - Risks, benefits, and  alternatives were reviewed. Through shared decision making, the patient elects to proceed with below. Assessment & Plan Patient reporting more clear understanding of her symptoms. Now feeling more like the spasm is her esophagus. No triggers. It is intermittent throughout the day.  Esophageal spasm despite good dietary changes and maximal medical therapy.  Chronic esophageal spasm with daily episodes, likely esophageal in origin. Diltiazem  chosen for lower interaction risk with rosuvastatin . - Prescribed diltiazem  ER 60 mg once daily; initiate at low dose, monitor efficacy and adverse effects. - Advised regular blood pressure monitoring. - Instructed to monitor for fatigue, muscle cramps, or arrhythmia; discontinue if these occur. - Referred to gastroenterology for further evaluation, including possible EGD. - Arranged follow-up telephone visit to assess response to diltiazem  and symptom control.  Gastroesophageal reflux disease Chronic reflux managed with famotidine , identified as a trigger for laryngeal issues. - Continue famotidine  20 mg daily. - Initiated omeprazole  20 mg twice daily.  Laryngopharyngeal reflux well-controlled on current  regimen. Ongoing control important for esophageal and laryngeal spasm management. - denies breakthrough symptoms - Advised continuation of omeprazole  and famotidine .  - Orders placed:  Orders Placed This Encounter  Procedures   Ambulatory referral to Gastroenterology   - Medications prescribed/continued/adjusted:  Meds ordered this encounter  Medications   diltiazem  (CARDIZEM  SR) 60 MG 12 hr capsule    Sig: Take 1 capsule (60 mg total) by mouth 2 (two) times daily.    Dispense:  60 capsule    Refill:  1   - Education materials provided to the patient. - Follow up: 1 month telephone visit to discuss medicaiton. Follow up with GI- referral sent.Denies ever having EGD. Patient instructed to return sooner or go to the ED if new/worsening symptoms  develop.   Thank you for allowing me the opportunity to care for your patient. Please do not hesitate to contact me should you have any other questions.  Sincerely, Penne Croak, DO Otolaryngologist (ENT) [facility] Phone: [phone] Fax: [phone]  11/27/2024, 8:12 PM

## 2024-12-23 ENCOUNTER — Ambulatory Visit (INDEPENDENT_AMBULATORY_CARE_PROVIDER_SITE_OTHER)

## 2024-12-23 ENCOUNTER — Ambulatory Visit: Admitting: Gastroenterology
